# Patient Record
Sex: Male | Born: 1940 | Race: White | Hispanic: No | Marital: Married | Smoking: Former smoker
Health system: Southern US, Academic
[De-identification: ages and names within clinical notes are randomized; demographics above are authoritative.]

## PROBLEM LIST (undated history)

## (undated) DIAGNOSIS — D497 Neoplasm of unspecified behavior of endocrine glands and other parts of nervous system: Secondary | ICD-10-CM

## (undated) DIAGNOSIS — F32A Depression, unspecified: Secondary | ICD-10-CM

## (undated) DIAGNOSIS — R270 Ataxia, unspecified: Secondary | ICD-10-CM

## (undated) DIAGNOSIS — I509 Heart failure, unspecified: Secondary | ICD-10-CM

## (undated) DIAGNOSIS — Z85850 Personal history of malignant neoplasm of thyroid: Secondary | ICD-10-CM

## (undated) DIAGNOSIS — K219 Gastro-esophageal reflux disease without esophagitis: Secondary | ICD-10-CM

## (undated) DIAGNOSIS — E785 Hyperlipidemia, unspecified: Secondary | ICD-10-CM

## (undated) DIAGNOSIS — I10 Essential (primary) hypertension: Secondary | ICD-10-CM

## (undated) DIAGNOSIS — I519 Heart disease, unspecified: Secondary | ICD-10-CM

## (undated) DIAGNOSIS — I2510 Atherosclerotic heart disease of native coronary artery without angina pectoris: Secondary | ICD-10-CM

## (undated) DIAGNOSIS — E079 Disorder of thyroid, unspecified: Secondary | ICD-10-CM

## (undated) DIAGNOSIS — H8109 Meniere's disease, unspecified ear: Secondary | ICD-10-CM

## (undated) DIAGNOSIS — E7800 Pure hypercholesterolemia, unspecified: Secondary | ICD-10-CM

## (undated) DIAGNOSIS — Z950 Presence of cardiac pacemaker: Secondary | ICD-10-CM

## (undated) DIAGNOSIS — C3490 Malignant neoplasm of unspecified part of unspecified bronchus or lung: Secondary | ICD-10-CM

## (undated) HISTORY — PX: HX THYROIDECTOMY: SHX17

## (undated) HISTORY — PX: LUNG REMOVAL, PARTIAL: SHX233

## (undated) HISTORY — PX: HX CORONARY ARTERY BYPASS GRAFT: SHX141

## (undated) HISTORY — PX: HX APPENDECTOMY: SHX54

## (undated) HISTORY — DX: Hyperlipidemia, unspecified: E78.5

## (undated) HISTORY — DX: Essential (primary) hypertension: I10

## (undated) HISTORY — PX: HX MITRAL VALVE REPAIR: 2100001183

## (undated) HISTORY — DX: Heart disease, unspecified: I51.9

## (undated) HISTORY — DX: Malignant neoplasm of unspecified part of unspecified bronchus or lung: C34.90

## (undated) HISTORY — DX: Personal history of malignant neoplasm of thyroid: Z85.850

## (undated) HISTORY — DX: Disorder of thyroid, unspecified: E07.9

## (undated) HISTORY — PX: HX CATARACT REMOVAL: SHX102

## (undated) HISTORY — PX: HX HEART SURGERY: 2100001149

## (undated) HISTORY — DX: Gastro-esophageal reflux disease without esophagitis: K21.9

## (undated) HISTORY — DX: Ataxia, unspecified: R27.0

## (undated) NOTE — Progress Notes (Signed)
 Formatting of this note is different from the original.  Subjective   Patient ID: Tanner [name] is a 21 y.o. male presenting to the Urgent Care with a chief complaint of Edema (Since his blood pressure medication has been changed in his feet and legs).    BLE after being taken off fluid pill.     History provided by:  Patient and relative  History limited by:  Age  Language interpreter used: No      Objective   Vitals:    04/19/24 1939   BP: 110/72   Pulse: 71   Temp: 36.6 C (97.9 F)   TempSrc: Tympanic   Weight: 86.2 kg (190 lb)   Height: 1.829 m (6')   BMI (Calculated): 25.76 kg/m2   BSA (Calculated - sq m): 2.09 sq meters   Resp: 20   SpO2: 96%       Social History     Tobacco Use   Smoking Status Never    Passive exposure: Never   Smokeless Tobacco Current    Types: Snuff       Vital signs reviewed.    Physical Exam  Vitals reviewed.   Constitutional:       Appearance: Normal appearance.   Cardiovascular:      Rate and Rhythm: Normal rate and regular rhythm.      Heart sounds: Normal heart sounds.   Pulmonary:      Effort: Pulmonary effort is normal.      Comments: Congested in chest  Musculoskeletal:         General: Swelling present.      Right lower leg: Edema present.      Left lower leg: Edema present.      Comments: 4+ pitting edema   Skin:     Findings: Erythema present.      Comments: Area from mid-foot to toes are swollen, red, warm to touch. Area under left toe is open and actively bleeding.   Neurological:      General: No focal deficit present.      Mental Status: He is alert and oriented to person, place, and time.   Psychiatric:         Mood and Affect: Mood normal.         Behavior: Behavior normal.         Assessment & Plan        In-House Lab Results:   No results found for this or any previous visit.     In-House Imaging Reads:        Procedure Documentation:  Procedures         ED Course & MDM   MDM - Medical Decision Making: Independent historian used.     Electronically signed by Dufm Stephane Lunger, CRNP at 04/19/2024  7:50 PM EST

---

## 1998-09-03 ENCOUNTER — Emergency Department (HOSPITAL_COMMUNITY): Payer: Self-pay | Admitting: Emergency Medicine

## 2011-09-08 DIAGNOSIS — S2249XA Multiple fractures of ribs, unspecified side, initial encounter for closed fracture: Secondary | ICD-10-CM

## 2011-09-08 DIAGNOSIS — R071 Chest pain on breathing: Secondary | ICD-10-CM

## 2011-09-08 DIAGNOSIS — M25519 Pain in unspecified shoulder: Secondary | ICD-10-CM

## 2011-09-15 DIAGNOSIS — S2239XA Fracture of one rib, unspecified side, initial encounter for closed fracture: Secondary | ICD-10-CM

## 2021-06-24 IMAGING — MR MRI ABDOMEN WITHOUT AND WITH CONTRAST
20 series · 48 of 48 positions shown · IV contrast (Gadavist)
Comparison: None available.

﻿EXAM:  98715   MRI ABDOMEN WITHOUT AND WITH CONTRAST
INDICATION: Renal mass.
TECHNIQUE: Multiplanar multisequential MRI of the kidneys was performed without and with 10 mL of Gadavist.

[Series 8: cor basg bh · coronal · 9.0mm · 1.38mm/px · 1 of 27 slices shown]
[im 1/27]
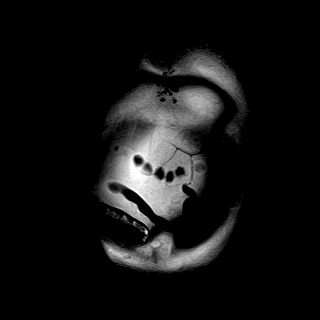

[Series 9: axial in/out phase · axial · 9.0mm · 1.88mm/px · 1 of 25 slices shown (1 of 2)]
[im 1/25]
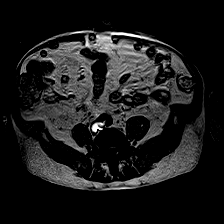

[Series 10: axial in/out phase · axial · 9.0mm · 1.88mm/px · 1 of 25 slices shown (2 of 2)]
[im 1/25]
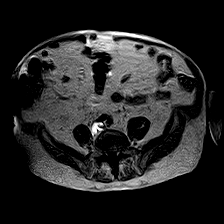

[Series 11: T2 · axial · 9.0mm · 1.64mm/px · z∈[-113,+127]mm · 2 of 25 slices shown]
[im 1/25]
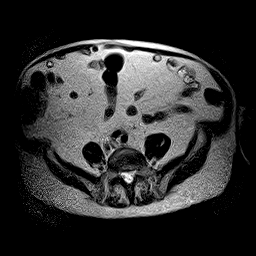
[im 25/25]
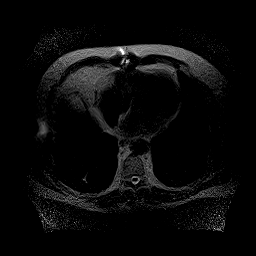

[Series 12: axial basg bh · axial · 9.0mm · 1.31mm/px · z∈[-113,+127]mm · 2 of 25 slices shown]
[im 1/25]
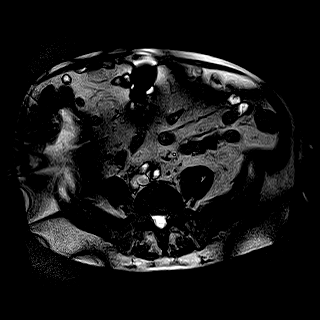
[im 25/25]
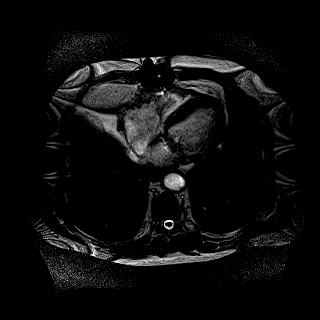

[Series 13: T2 fat-sat · axial · 9.0mm · 1.88mm/px · z∈[-113,+127]mm · 2 of 25 slices shown]
[im 1/25]
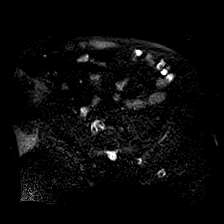
[im 25/25]
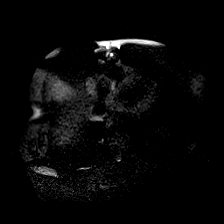

[Series 14: T1 · coronal · 8.0mm · 1.88mm/px · 2 of 28 slices shown (1 of 2)]
[im 1/28]
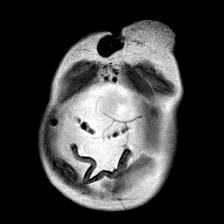
[im 28/28]
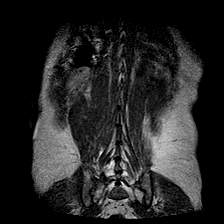

[Series 15: pre axial (person_name) · axial · non-contrast · 8.5mm · 1.56mm/px · z∈[-101,+90]mm · 3 of 46 slices shown]
[im 1/46]
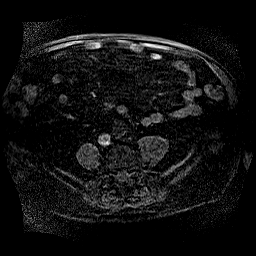
[im 23/46]
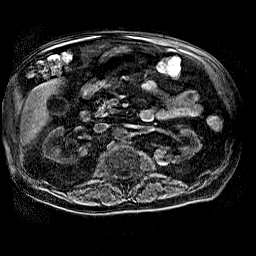
[im 46/46]
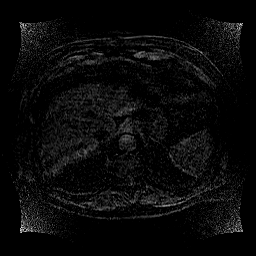

[Series 16: axial (person_name) - · axial · arterial · 8.5mm · 1.56mm/px · z∈[-101,+90]mm · 3 of 46 slices shown (1 of 5)]
[im 1/46]
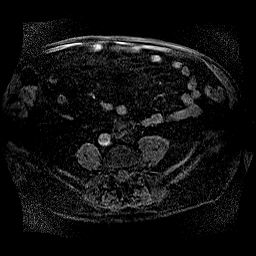
[im 23/46]
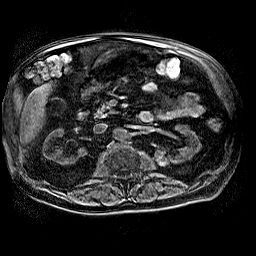
[im 46/46]
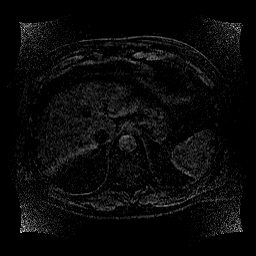

[Series 17: axial (person_name) - · axial · portal-venous · 8.5mm · 1.56mm/px · z∈[-101,+90]mm · 3 of 46 slices shown (2 of 5)]
[im 1/46]
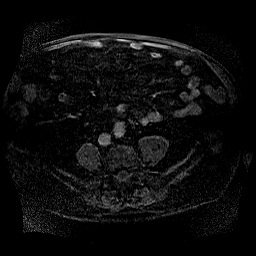
[im 23/46]
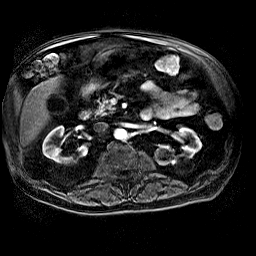
[im 46/46]
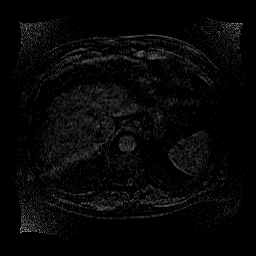

[Series 18: axial (person_name) - · axial · delayed · 8.5mm · 1.56mm/px · z∈[-101,+90]mm · 3 of 46 slices shown (3 of 5)]
[im 1/46]
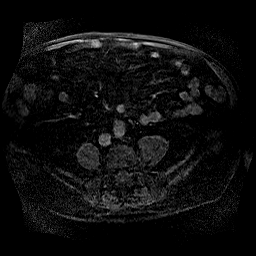
[im 23/46]
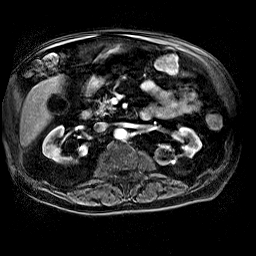
[im 46/46]
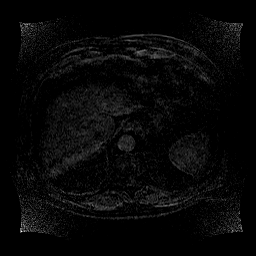

[Series 19: axial (person_name) - · axial · delayed · 8.5mm · 1.56mm/px · z∈[-101,+90]mm · 3 of 46 slices shown (4 of 5)]
[im 1/46]
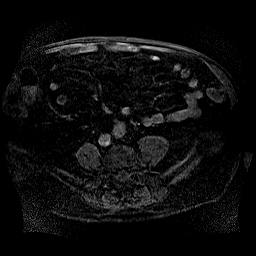
[im 23/46]
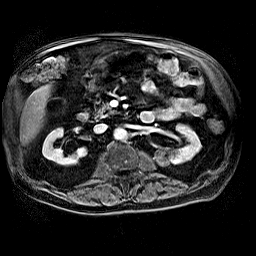
[im 46/46]
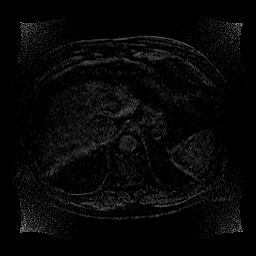

[Series 20: axial (person_name) - · axial · delayed · 8.5mm · 1.56mm/px · z∈[-101,+90]mm · 3 of 46 slices shown (5 of 5)]
[im 1/46]
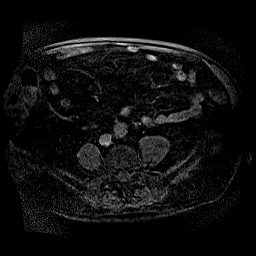
[im 23/46]
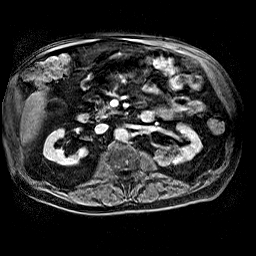
[im 46/46]
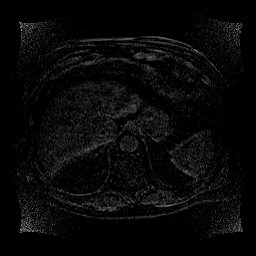

[Series 21: T1 · coronal · delayed · 8.0mm · 1.88mm/px · 2 of 28 slices shown (2 of 2)]
[im 1/28]
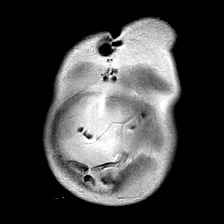
[im 28/28]
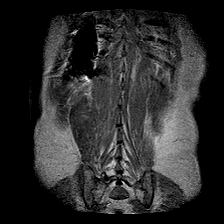

[Series 22: addsub-sub · axial · 8.5mm · 1.56mm/px · z∈[-101,+90]mm · 3 of 46 slices shown]
[im 1/46]
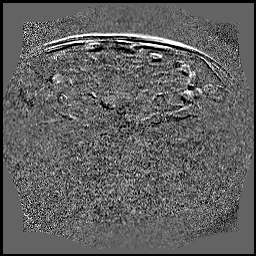
[im 23/46]
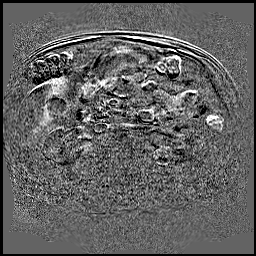
[im 46/46]
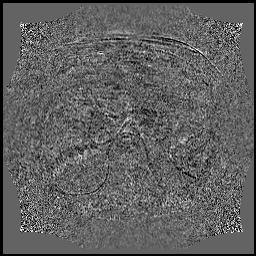

[Series 23: (id) · axial · 8.5mm · 1.56mm/px · z∈[-101,+90]mm · 3 of 46 slices shown (1 of 5)]
[im 1/46]
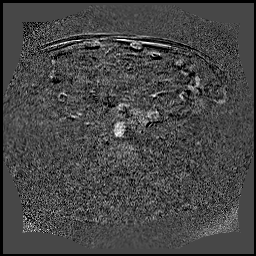
[im 23/46]
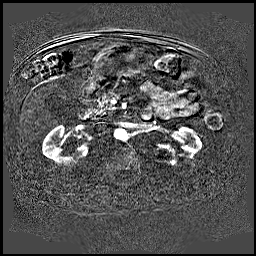
[im 46/46]
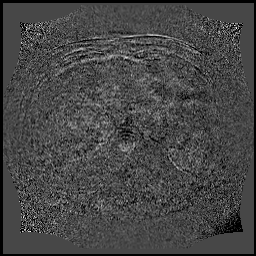

[Series 24: (id) · axial · 8.5mm · 1.56mm/px · z∈[-101,+90]mm · 3 of 46 slices shown (2 of 5)]
[im 1/46]
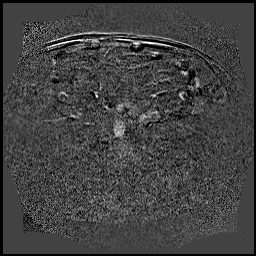
[im 23/46]
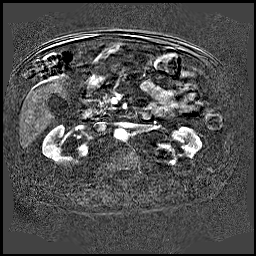
[im 46/46]
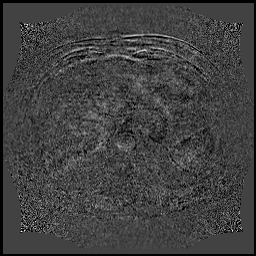

[Series 25: (id) · axial · 8.5mm · 1.56mm/px · z∈[-101,+90]mm · 3 of 46 slices shown (3 of 5)]
[im 1/46]
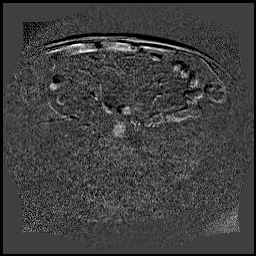
[im 23/46]
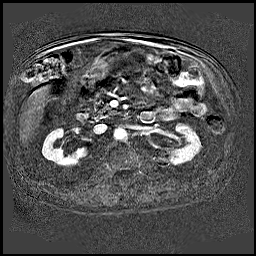
[im 46/46]
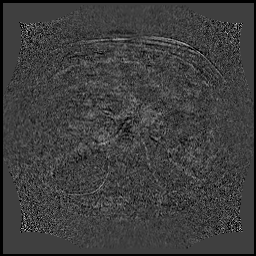

[Series 26: (id) · axial · 8.5mm · 1.56mm/px · z∈[-101,+90]mm · 3 of 46 slices shown (4 of 5)]
[im 1/46]
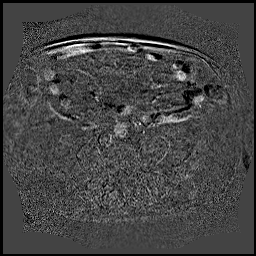
[im 23/46]
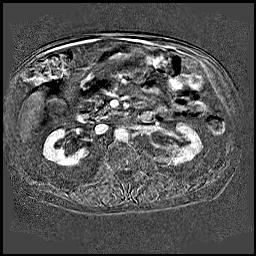
[im 46/46]
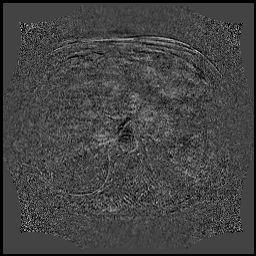

[Series 27: (id) · coronal · 8.0mm · 1.88mm/px · 2 of 28 slices shown (5 of 5)]
[im 1/28]
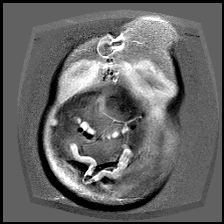
[im 28/28]
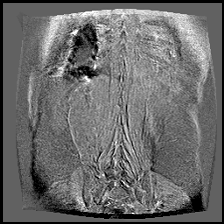

[48 of 48 positions shown; findings below may reference images not displayed]

FINDINGS: There is mild bilateral renal cortical thinning. There is a 3.1 x 2.8 cm heterogeneously enhancing solid mass within the medial aspect of left kidney interpolar region.  Multiple bilateral renal cysts are also noted measuring up to 8.4 cm within right kidney upper pole.  There is suggestion of tiny gallstone.  There is fatty infiltration of the liver.  Spleen, pancreas and adrenal glands are normal.  No ascites or pleural effusion is seen. There is no definite retroperitoneal adenopathy. There is a 3.1 cm mid abdominal aortic aneurysm.
IMPRESSION: Imaging findings suspicious for a 3.1 cm left kidney interpolar region renal cell carcinoma.  Follow-up urology evaluation is recommended.

## 2021-07-01 IMAGING — MR MRI BRAIN WITHOUT AND WITH CONTRAST
13 series · 45 of 48 positions shown · IV contrast (gadavist)
Comparison: None available.

﻿EXAM:  MRI BRAIN WITHOUT AND WITH CONTRAST
INDICATION: Ataxia, history of pituitary tumor.
TECHNIQUE: Multiplanar multisequential MRI of the brain and pituitary gland was performed without and with 5 mL of Gadavist.

[Series 5: DWI · axial · 5.0mm · 1.35mm/px · z∈[-30,+96]mm · 9 of 88 slices shown (1 of 3)]
[im 1/88]
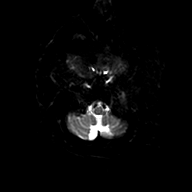
[im 16/88]
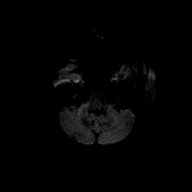
[im 24/88]
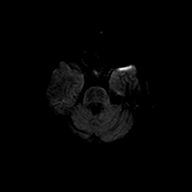
[im 40/88]
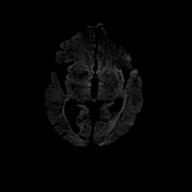
[im 48/88]
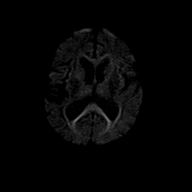
[im 64/88]
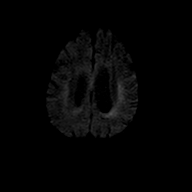
[im 72/88]
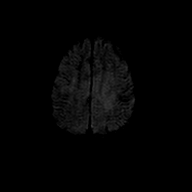
[im 80/88]
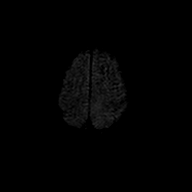
[im 88/88]
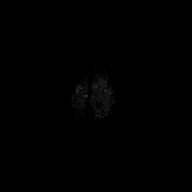

[Series 6: DWI · axial · 5.0mm · 1.35mm/px · z∈[-30,+96]mm · 3 of 22 slices shown (2 of 3)]
[im 1/22]
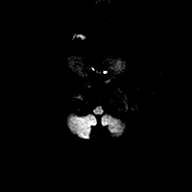
[im 11/22]
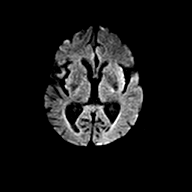
[im 22/22]
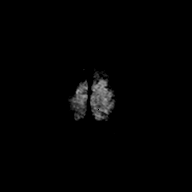

[Series 7: DWI · axial · 5.0mm · 1.35mm/px · z∈[-30,+96]mm · 3 of 22 slices shown (3 of 3)]
[im 1/22]
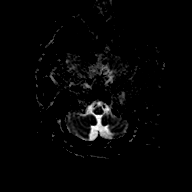
[im 11/22]
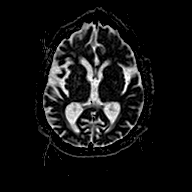
[im 22/22]
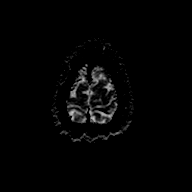

[Series 8: FLAIR · sagittal · 4.0mm · 0.75mm/px · 4 of 26 slices shown (1 of 2)]
[im 1/26]
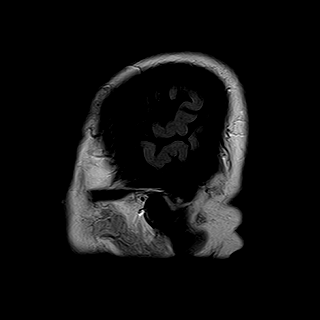
[im 9/26]
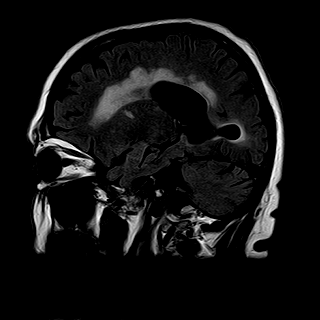
[im 17/26]
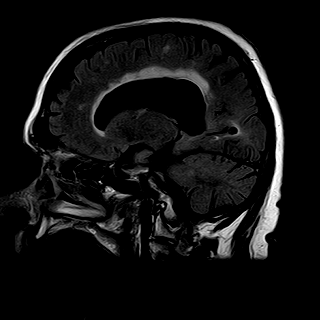
[im 26/26]
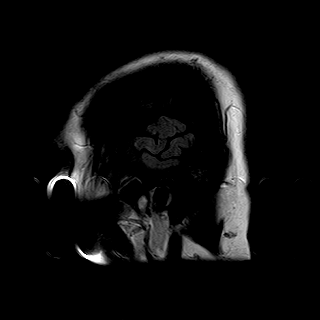

[Series 9: T2 · axial · 5.0mm · 0.43mm/px · z∈[-36,+108]mm · 4 of 25 slices shown]
[im 1/25]
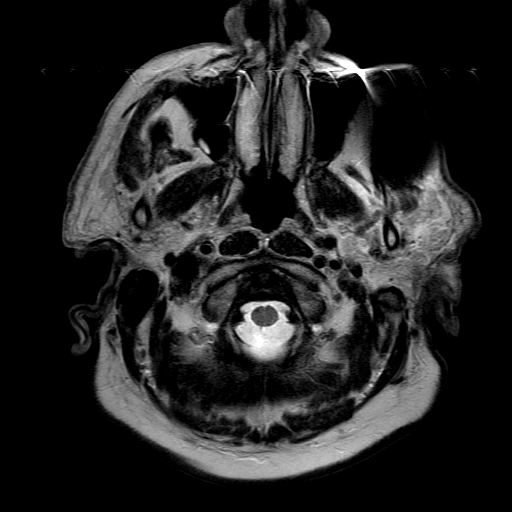
[im 9/25]
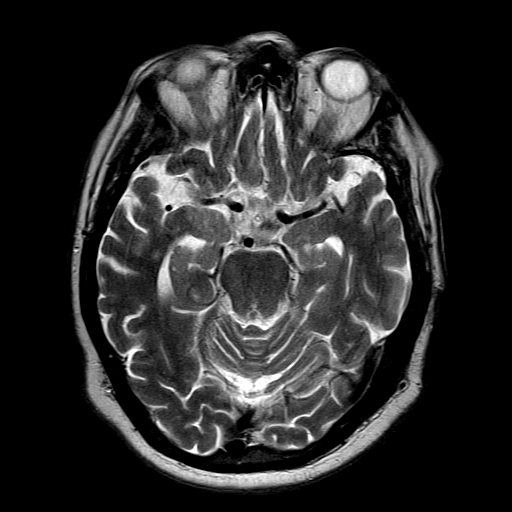
[im 17/25]
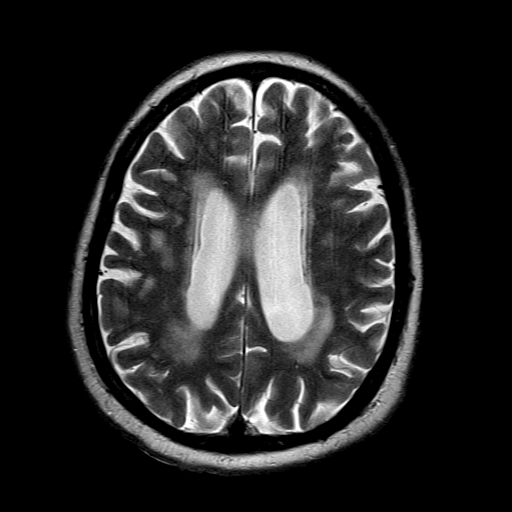
[im 25/25]
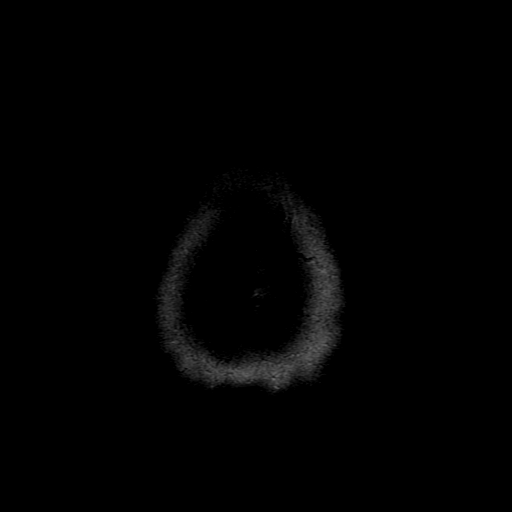

[Series 10: FLAIR · axial · 5.0mm · 0.69mm/px · z∈[-27,+99]mm · 3 of 22 slices shown (2 of 2)]
[im 1/22]
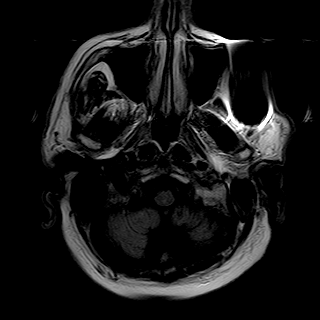
[im 11/22]
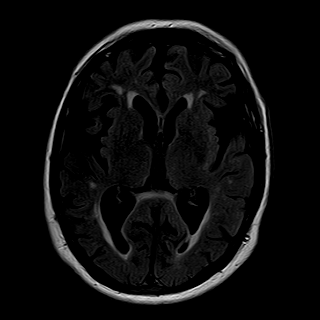
[im 22/22]
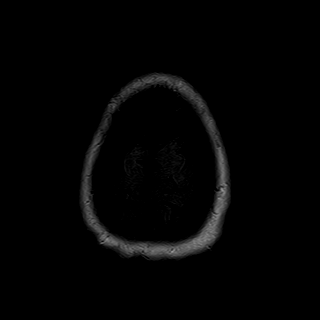

[Series 11: T1 · axial · 5.0mm · 0.49mm/px · z∈[-27,+99]mm · 3 of 22 slices shown (1 of 4)]
[im 1/22]
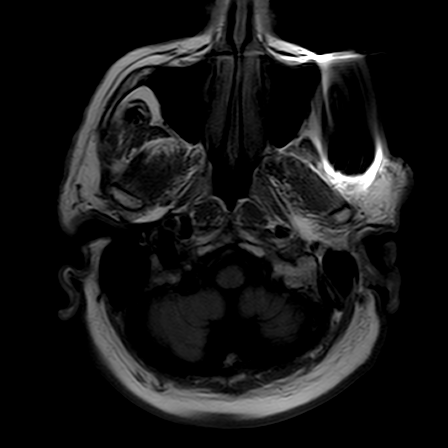
[im 11/22]
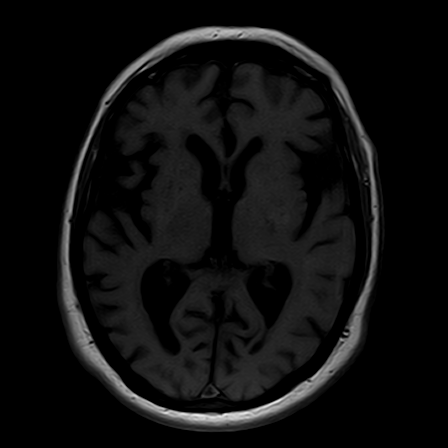
[im 22/22]
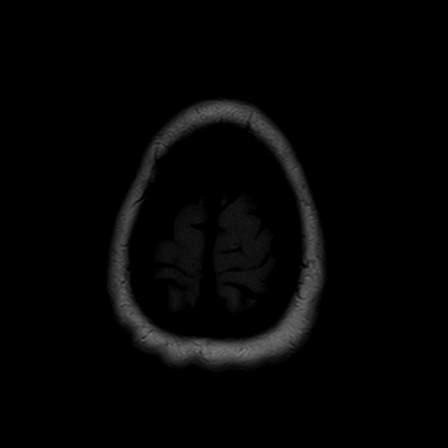

[Series 12: T1 · sagittal · 3.0mm · 0.56mm/px · 2 of 13 slices shown (2 of 4)]
[im 1/13]
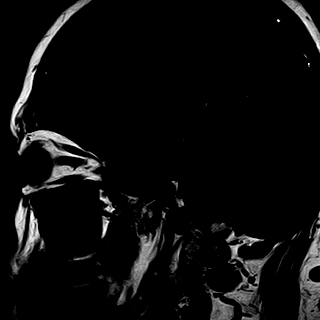
[im 13/13]
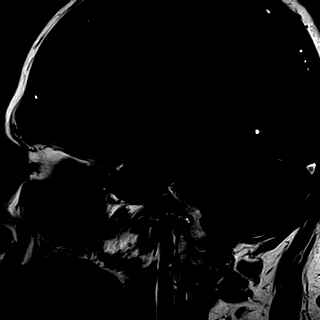

[Series 13: T1 · coronal · 3.0mm · 0.56mm/px · 2 of 13 slices shown (3 of 4)]
[im 1/13]
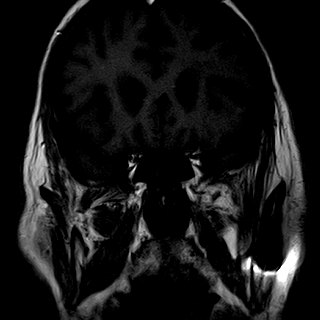
[im 13/13]
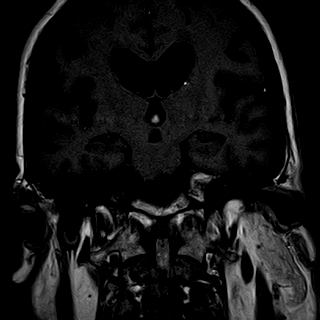

[Series 14: T1 fat-sat · axial · 5.0mm · 0.57mm/px · z∈[-51,+123]mm · 4 of 30 slices shown]
[im 1/30]
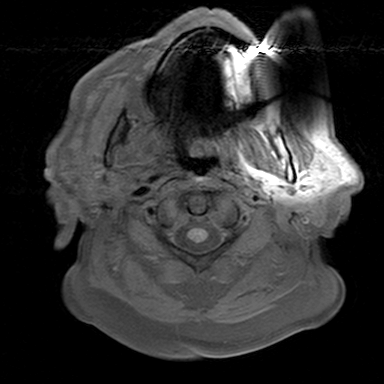
[im 10/30]
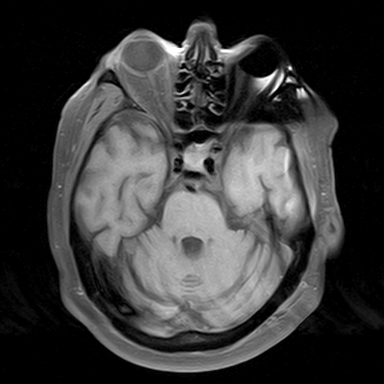
[im 20/30]
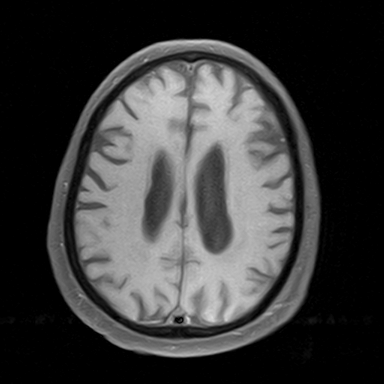
[im 30/30]
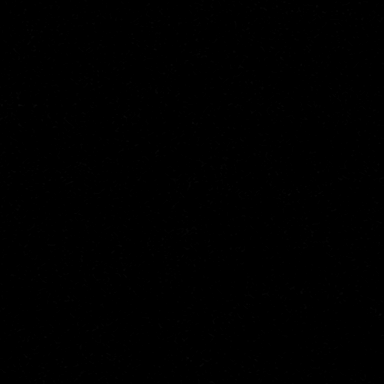

[Series 15: T1 post-contrast · coronal · 3.0mm · 0.56mm/px · 2 of 13 slices shown (1 of 2)]
[im 1/13]
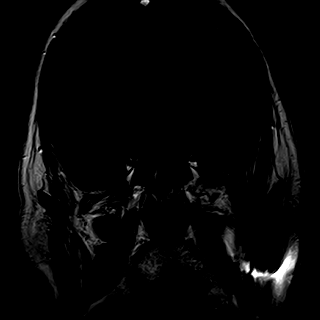
[im 13/13]
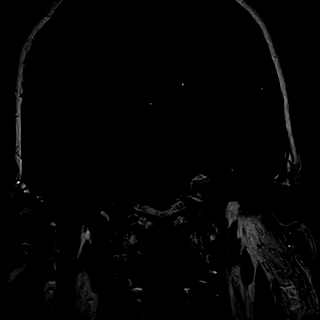

[Series 16: T1 post-contrast · sagittal · 3.0mm · 0.56mm/px · 2 of 13 slices shown (2 of 2)]
[im 1/13]
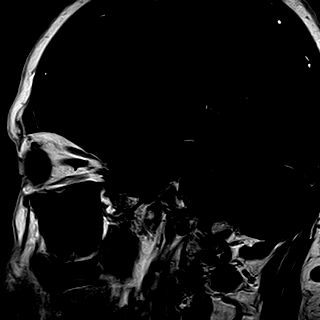
[im 13/13]
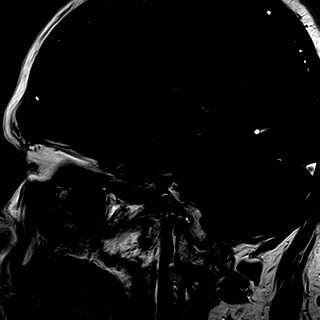

[Series 19: T1 · axial · 5.0mm · 0.57mm/px · z∈[-51,+123]mm · 4 of 30 slices shown (4 of 4)]
[im 1/30]
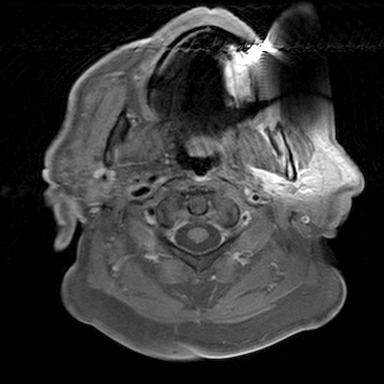
[im 10/30]
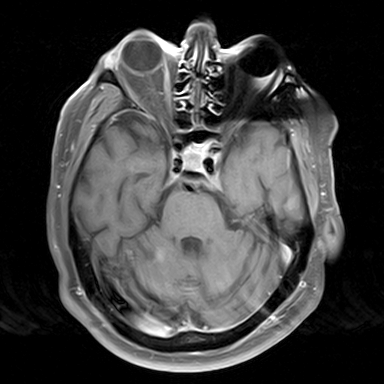
[im 20/30]
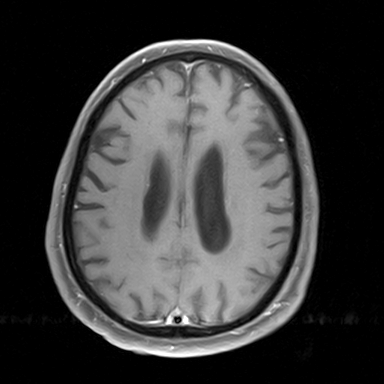
[im 30/30]
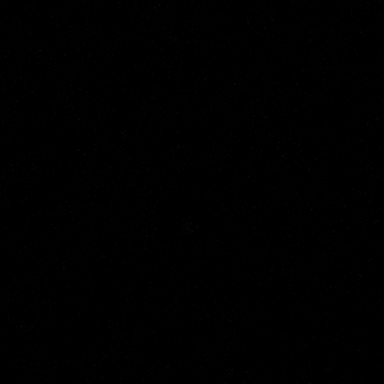

[45 of 48 positions shown; findings below may reference images not displayed]

FINDINGS: Ventricular and sulcal size is normal for the patient's age.  There are moderate chronic small vessel ischemic changes.  There is no mass effect, midline shift or intracranial hemorrhage. There is no evidence of acute infarction. Skull base flow voids and basal cisterns are patent.  There is a 12 x 14 mm heterogeneously enhancing pituitary mass with slight suprasellar extension. The pituitary infundibulum is midline.  There is no mass effect on the optic chiasm.  There is no abnormal parenchymal or leptomeningeal enhancement.  There are no extra-axial fluid collections. Visualized paranasal sinuses, mastoid air cells and orbital contents are unremarkable.
IMPRESSION: 1. Suggestion of a pituitary macroadenoma as detailed above. No acute intracranial abnormality.  

 2. No abnormal parenchymal or leptomeningeal enhancement.

## 2021-09-25 ENCOUNTER — Ambulatory Visit (INDEPENDENT_AMBULATORY_CARE_PROVIDER_SITE_OTHER): Payer: Self-pay | Admitting: Student in an Organized Health Care Education/Training Program

## 2021-10-13 ENCOUNTER — Ambulatory Visit (INDEPENDENT_AMBULATORY_CARE_PROVIDER_SITE_OTHER): Payer: 59 | Admitting: Student in an Organized Health Care Education/Training Program

## 2021-10-13 ENCOUNTER — Encounter (INDEPENDENT_AMBULATORY_CARE_PROVIDER_SITE_OTHER): Payer: Self-pay | Admitting: Student in an Organized Health Care Education/Training Program

## 2021-10-13 ENCOUNTER — Other Ambulatory Visit: Payer: Self-pay

## 2021-10-13 VITALS — BP 136/71 | HR 58 | Resp 16 | Ht 72.0 in | Wt 207.0 lb

## 2021-10-13 DIAGNOSIS — Z125 Encounter for screening for malignant neoplasm of prostate: Secondary | ICD-10-CM

## 2021-10-13 DIAGNOSIS — R9389 Abnormal findings on diagnostic imaging of other specified body structures: Secondary | ICD-10-CM

## 2021-10-13 DIAGNOSIS — N2889 Other specified disorders of kidney and ureter: Secondary | ICD-10-CM

## 2021-10-13 DIAGNOSIS — Z85850 Personal history of malignant neoplasm of thyroid: Secondary | ICD-10-CM

## 2021-10-13 DIAGNOSIS — N401 Enlarged prostate with lower urinary tract symptoms: Secondary | ICD-10-CM

## 2021-10-13 DIAGNOSIS — Z85118 Personal history of other malignant neoplasm of bronchus and lung: Secondary | ICD-10-CM

## 2021-10-13 DIAGNOSIS — N400 Enlarged prostate without lower urinary tract symptoms: Secondary | ICD-10-CM

## 2021-10-13 MED ORDER — TAMSULOSIN 0.4 MG CAPSULE
0.4000 mg | ORAL_CAPSULE | Freq: Every evening | ORAL | 5 refills | Status: AC
Start: 2021-10-13 — End: ?

## 2021-10-14 NOTE — Progress Notes (Signed)
[place]    Progress Note    Name: Tanner [name] MRN:  [id number]   Date: 10/13/2021 Age: 81 y.o.       Chief Complaint: Mass (New Tanner [name], possible renal cell carcinoma)    Subjective:   Tanner [name] is a pleasant 81 year old man who presents with a recent abnormal MRI demonstrating a left enhancing renal mass measuring 3 cm.  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  He denies a family history of urologic malignancy.  He reports urinary problems with decreased force of stream and dribbling.  He is not been on BPH medications in the past. He denies fevers, chills, nausea, vomiting, hematuria, dysuria, flank pain, incontinence, dribbling, hesitancy, suprapubic pain, headaches, vision changes, shortness of breath, chest pain.    Objective :  BP 136/71   Pulse 58   Resp 16   Ht 1.829 m (6')   Wt 93.9 kg (207 lb)   SpO2 98%   BMI 28.07 kg/m       Gen: NAD, alert  Pulm: unlabored at rest  CV: palpable pulses  Abd: soft, Nt/ND  GU: no suprapubic tenderness, no CVAT    Data reviewed:    Current Outpatient Medications   Medication Sig   . albuterol sulfate (PROVENTIL OR VENTOLIN OR PROAIR) 90 mcg/actuation Inhalation oral inhaler Take 2 Puffs by inhalation Once a day   . aspirin 81 mg Oral Tablet, Chewable Chew 1 Tablet (81 mg total) Once a day   . atorvastatin (LIPITOR) 80 mg Oral Tablet Take 1 Tablet (80 mg total) by mouth Every night   . bromocriptine (PARLODEL) 2.5 mg Oral Tablet Take 1 Tablet (2.5 mg total) by mouth Once a day   . calcium carbonate-vitamin D3 600 mg-10 mcg (400 unit) Oral Tablet Take 2 Tablets by mouth Once a day   . diphenhydrAMINE (BENADRYL) 50 mg Oral Capsule Take 1 Capsule (50 mg total) by mouth Every night   . fluticasone propion-salmeteroL (ADVAIR) 500-50 mcg/dose Inhalation oral diskus inhaler Take 1 Puff (1 INHALATION  total) by inhalation Twice daily   .  ibuprofen (MOTRIN) 400 mg Oral Tablet Take 1 Tablet (400 mg total) by mouth Twice per day as needed   . ketotifen fumarate (EYE ITCH RELIEF) 0.025 % (0.035 %) Ophthalmic Drops Instill 1 Drop into both eyes Twice daily   . levothyroxine (SYNTHROID) 100 mcg Oral Tablet Take 1 Tablet (100 mcg total) by mouth Once a day   . losartan (COZAAR) 50 mg Oral Tablet Take 0.5 Tablets (25 mg total) by mouth Once a day   . metoprolol tartrate (LOPRESSOR) 25 mg Oral Tablet Take 0.5 Tablets (12.5 mg total) by mouth Twice daily   . omeprazole (PRILOSEC) 20 mg Oral Capsule, Delayed Release(E.C.) Take 1 Capsule (20 mg total) by mouth Once a day   . tamsulosin (FLOMAX) 0.4 mg Oral Capsule Take 1 Capsule (0.4 mg total) by mouth Every evening after dinner   . torsemide (DEMADEX) 20 mg Oral Tablet Take 0.5 Tablets (10 mg total) by mouth Once a day     Assessment/Plan  Problem List Items Addressed This Visit    None  Visit Diagnoses     Renal mass    -  Primary    Relevant Orders    CT BIOPSY RENAL        Left enhancing renal mass,  3.1cm, with history of lung and thyroid cancer   I had a very lengthy discussion with Tanner [name] where I reviewed in detail recent imaging including MRI of the abdomen. Importantly, the Tanner [name]'s renal cell carcinoma-specific risk factors include: hypertension, obesity, previous nicotine dependence    Tanner [name] was educated and counseled on the incidence, nature and risk factors for renal cell carcinoma; notably, I stressed the 80-90% probability that a solid mass of this nature and imaging characteristics represents malignancy. Treatment options were discussed including observation, percutaneous biopsy, radical nephrectomy (open v. laparoscopic), partial nephrectomy (open v. laparoscopic), thermal ablation (open v. laparoscopic v. percutaneous). The risks, advantages, and disadvantages of each approach were discussed in detail.     We briefly discussed the role of percutaneous, image-guided renal mass biopsy in  patients in whom it might impact management, particularly in patients with clinical or radiographic findings suggestive of lymphoma, abscess or metastasis.  We also discussed the limitations of renal mass biopsy including the non-diagnostic yield and hybrid-type tumors which may lend a false sense of reassurance of a perceived indolent tumor such as oncocytoma.  Additionally, renal mass biopsy is not without risks with well-documented risks of retroperitoneal hemorrhage and possible, albeit rare, biopsy tract seeding.    For clinical T1a renal masses (<4.0 cm) in an otherwise health Tanner [name] without coexistant medical comorbidities or baseline chronic kidney disease, complete surgical excision achieved by either partial nephrectomy or radical nephrectomy when nephron sparing surgery is not technically feasible are considered the standard of care per the 2009 American Urological Association "Guideline for the Management of the Clinical Stage 1 Renal Mass".  Partial nephrectomy, if possible, is recommended when the need exists to preserve renal function, however, can be associated with increased urologic morbidity including higher risk of hemorrhage and urinary fistula.    For clinical T1b+ (>4 cm) renal masses in an otherwise healthy Tanner [name], both radical nephrectomy in the setting of a normal contralateral kidney or partial nephrectomy when the need exists to preserve renal function are considered standards, each given equal weight per the 2009 American Urological Association "Guideline for the Management of the Clinical Stage 1 Renal Mass".  I spent time discussing with Tanner [name] that the overwhelming majority of studies evaluating partial nephrectomy in higher stage (cT1b+) are observational, retrospective, reported findings on samples of convenience that were not randomized to treatments and involved only one treatment group. There are inherent, unknownand unquantifiable biases within each study because of the lack  of randomization and precludes confident evidence-based guideline directives      Tanner [name]'s potential risk factors for the development of chronic kidney disease include:  hypertension, cardiovascular disease, nicotine dependence, obesity, dyslipidemia, positive family history of kidney disease, advanced age > 44 years.  We discussed the understood risks of either open or minimally invasive renal surgery including bleeding requiring blood transfusion, infection, adjacent organ injury, urine leak, positive margins, severe pain and standard operative risks such as myocardial infarction, stroke, venous thromboembolism (VTE), pneumonia, PE and even a 0.2% chance of death.      Thermal ablative therapies, either cryoablation or radiofrequency ablation, are considered less-invasive treatment options, however, may be associated with slightly higher risk of local tumor recurrence and more challenging surgical salvage; additionally, measures of success are less well-defined.    Compared to surgical extirpation, there are inherent, unknownand unquantifiable biases within the evidence for thermal ablation due to the lack of randomization and precludes confident evidence-based guideline directives.    Active surveillance with delayed intervention was  also discussed with Tanner [name]  as an option in patients who want to avoid surgery and are willing to accept an increased risk of tumor progression compared to either radical nephrectomy or partial nephrectomy.  Ideally, active surveillance is best utilized in a small renal mass (<4 cm) in a Tanner [name] with high operative and anesthetic risks.    At this time given his history of 2 other malignancies which were rather aggressive per the Tanner [name]'s report we will need to confirm that this is renal cell carcinoma versus a metastatic focus.  A CT renal mass biopsy was ordered.  The Tanner [name] was instructed to discontinue his aspirin 7 days before and resume his aspirin 7 days after the biopsy.   He will follow-up following biopsy for further discussion about next best steps.      BPH/LUTS  . Discussed the differential diagnosis, pathophysiology and nature of benign prostate enlargement causing lower urinary tract symptoms  . Counseled Tanner [name] on conservative management options including appropriate fluid management, avoidance of diuretics including caffeine and alcohol  . Prescription provided for Tamsulosin Hydrochloride (FlomaxT) 0.4 mg P.O. daily:    . I have discussed in great detail with the Tanner [name] the treatment of prostate enlargement/lower urinary tract symptoms using tamsulosin.  I have explained my rationale for using tamsulosin as well as potential risks of asthenia (2%), dizziness (5%), rhinitis (3%) and abnormal ejaculation (11%). I encouraged Tanner [name] to report any prior or current history of alpha-1 antagonist use to their opthalmic surgeon before undergoing any eye surgery due to the risk of intraoperative floppy iris syndrome.  The Tanner [name] expressed an understanding of the treatment, possible reactions, and possible prognosis.        Prostate cancer screening  . Based on Tanner [name]'s clinical findings including his recent prostate specific antigen level, age, ethnicity and relevant risk factors and in accordance with the American Urological Association (AUA) & [place] (NCCN) published screening guidelines, I would recommend the following:  . No further prostate specific antigen screening in men over age 66 due to a higher prevalence of prostate cancer but greater risk of competing diseases and overdiagnosis when compared to younger men and strong evidence for a lack of treatment benefit for men in this age group          Landis Gandy, DO     A combined total of 45 minutes were spent preparing to see the Tanner [name], reviewing previous records, ordering tests/medications/procedures, documenting the clinical encounter as well as performing a medically appropriate  evaluation and independently interpreting results and communicating them to the Tanner [name]/family/caregiver as specifically outlined above in the impression and plan.

## 2021-10-20 ENCOUNTER — Ambulatory Visit
Admission: RE | Admit: 2021-10-20 | Discharge: 2021-10-20 | Disposition: A | Discharge status: Home / Self Care | Payer: 59 | Source: Ambulatory Visit | Attending: Student in an Organized Health Care Education/Training Program | Admitting: Student in an Organized Health Care Education/Training Program

## 2021-10-20 ENCOUNTER — Other Ambulatory Visit: Payer: Self-pay

## 2021-10-27 ENCOUNTER — Other Ambulatory Visit: Payer: Self-pay

## 2021-10-27 ENCOUNTER — Encounter (HOSPITAL_COMMUNITY): Payer: Self-pay

## 2021-10-27 ENCOUNTER — Emergency Department (EMERGENCY_DEPARTMENT_HOSPITAL): Payer: 59

## 2021-10-27 ENCOUNTER — Inpatient Hospital Stay (HOSPITAL_COMMUNITY): Payer: 59 | Admitting: Internal Medicine

## 2021-10-27 ENCOUNTER — Inpatient Hospital Stay
Admission: EM | Admit: 2021-10-27 | Discharge: 2021-10-30 | DRG: 243 | Disposition: A | Discharge status: Home / Self Care | Payer: 59 | Attending: Nephrology | Admitting: Nephrology

## 2021-10-27 ENCOUNTER — Ambulatory Visit (HOSPITAL_COMMUNITY)
Admission: RE | Admit: 2021-10-27 | Discharge: 2021-10-27 | Disposition: A | Discharge status: Home / Self Care | Payer: 59 | Source: Ambulatory Visit | Attending: Student in an Organized Health Care Education/Training Program | Admitting: Student in an Organized Health Care Education/Training Program

## 2021-10-27 ENCOUNTER — Ambulatory Visit (HOSPITAL_COMMUNITY): Payer: Self-pay

## 2021-10-27 DIAGNOSIS — I509 Heart failure, unspecified: Secondary | ICD-10-CM

## 2021-10-27 DIAGNOSIS — I5032 Chronic diastolic (congestive) heart failure: Secondary | ICD-10-CM | POA: Diagnosis present

## 2021-10-27 DIAGNOSIS — Z85118 Personal history of other malignant neoplasm of bronchus and lung: Secondary | ICD-10-CM

## 2021-10-27 DIAGNOSIS — Z79890 Hormone replacement therapy (postmenopausal): Secondary | ICD-10-CM

## 2021-10-27 DIAGNOSIS — E079 Disorder of thyroid, unspecified: Secondary | ICD-10-CM

## 2021-10-27 DIAGNOSIS — R0602 Shortness of breath: Secondary | ICD-10-CM

## 2021-10-27 DIAGNOSIS — I071 Rheumatic tricuspid insufficiency: Secondary | ICD-10-CM | POA: Diagnosis present

## 2021-10-27 DIAGNOSIS — R001 Bradycardia, unspecified: Principal | ICD-10-CM | POA: Diagnosis present

## 2021-10-27 DIAGNOSIS — N2889 Other specified disorders of kidney and ureter: Secondary | ICD-10-CM | POA: Insufficient documentation

## 2021-10-27 DIAGNOSIS — K219 Gastro-esophageal reflux disease without esophagitis: Secondary | ICD-10-CM | POA: Diagnosis present

## 2021-10-27 DIAGNOSIS — I495 Sick sinus syndrome: Principal | ICD-10-CM | POA: Diagnosis present

## 2021-10-27 DIAGNOSIS — E890 Postprocedural hypothyroidism: Secondary | ICD-10-CM | POA: Diagnosis present

## 2021-10-27 DIAGNOSIS — Z87891 Personal history of nicotine dependence: Secondary | ICD-10-CM

## 2021-10-27 DIAGNOSIS — J449 Chronic obstructive pulmonary disease, unspecified: Secondary | ICD-10-CM | POA: Diagnosis present

## 2021-10-27 DIAGNOSIS — I10 Essential (primary) hypertension: Secondary | ICD-10-CM | POA: Diagnosis present

## 2021-10-27 DIAGNOSIS — Z7982 Long term (current) use of aspirin: Secondary | ICD-10-CM

## 2021-10-27 DIAGNOSIS — I2510 Atherosclerotic heart disease of native coronary artery without angina pectoris: Secondary | ICD-10-CM | POA: Diagnosis present

## 2021-10-27 DIAGNOSIS — R531 Weakness: Secondary | ICD-10-CM

## 2021-10-27 DIAGNOSIS — I110 Hypertensive heart disease with heart failure: Secondary | ICD-10-CM | POA: Diagnosis present

## 2021-10-27 DIAGNOSIS — E785 Hyperlipidemia, unspecified: Secondary | ICD-10-CM | POA: Diagnosis present

## 2021-10-27 DIAGNOSIS — Z7951 Long term (current) use of inhaled steroids: Secondary | ICD-10-CM

## 2021-10-27 DIAGNOSIS — I441 Atrioventricular block, second degree: Secondary | ICD-10-CM | POA: Diagnosis present

## 2021-10-27 DIAGNOSIS — Z951 Presence of aortocoronary bypass graft: Secondary | ICD-10-CM

## 2021-10-27 LAB — CBC WITH DIFF
BASOPHIL #: 0 10*3/uL (ref 0.00–0.30)
BASOPHIL %: 1 % (ref 0–3)
EOSINOPHIL #: 0.1 10*3/uL (ref 0.00–0.80)
EOSINOPHIL %: 3 % (ref 0–7)
HCT: 39.1 % — ABNORMAL LOW (ref 42.0–51.0)
HGB: 13.1 g/dL — ABNORMAL LOW (ref 13.5–18.0)
LYMPHOCYTE #: 1.3 10*3/uL (ref 1.10–5.00)
LYMPHOCYTE %: 26 % (ref 25–45)
MCH: 30.4 pg (ref 27.0–32.0)
MCHC: 33.5 g/dL (ref 32.0–36.0)
MCV: 90.8 fL (ref 78.0–99.0)
MONOCYTE #: 0.4 10*3/uL (ref 0.00–1.30)
MONOCYTE %: 8 % (ref 0–12)
MPV: 8 fL (ref 7.4–10.4)
NEUTROPHIL #: 3.3 10*3/uL (ref 1.80–8.40)
NEUTROPHIL %: 64 % (ref 40–76)
PLATELETS: 152 10*3/uL (ref 140–440)
RBC: 4.31 10*6/uL (ref 4.20–6.00)
RDW: 14.4 % (ref 11.6–14.8)
WBC: 5.2 10*3/uL (ref 4.0–10.5)
WBCS UNCORRECTED: 5.2 10*3/uL

## 2021-10-27 LAB — COMPREHENSIVE METABOLIC PANEL, NON-FASTING
ALBUMIN/GLOBULIN RATIO: 1.4 (ref 0.8–1.4)
ALBUMIN: 3.6 g/dL (ref 3.5–5.7)
ALKALINE PHOSPHATASE: 91 U/L (ref 34–104)
ALT (SGPT): 10 U/L (ref 7–52)
ANION GAP: 6 mmol/L — ABNORMAL LOW (ref 10–20)
AST (SGOT): 14 U/L (ref 13–39)
BILIRUBIN TOTAL: 0.8 mg/dL (ref 0.3–1.2)
BUN/CREA RATIO: 13 (ref 6–22)
BUN: 13 mg/dL (ref 7–25)
CALCIUM, CORRECTED: 9.3 mg/dL (ref 8.9–10.8)
CALCIUM: 8.9 mg/dL (ref 8.6–10.3)
CHLORIDE: 111 mmol/L — ABNORMAL HIGH (ref 98–107)
CO2 TOTAL: 26 mmol/L (ref 21–31)
CREATININE: 1.04 mg/dL (ref 0.60–1.30)
ESTIMATED GFR: 72 mL/min/{1.73_m2} (ref >59)
GLOBULIN: 2.5 — ABNORMAL LOW (ref 2.9–5.4)
GLUCOSE: 83 mg/dL (ref 74–109)
OSMOLALITY, CALCULATED: 284 mOsm/kg (ref 270–290)
POTASSIUM: 4.2 mmol/L (ref 3.5–5.1)
PROTEIN TOTAL: 6.1 g/dL — ABNORMAL LOW (ref 6.4–8.9)
SODIUM: 143 mmol/L (ref 136–145)

## 2021-10-27 LAB — ECG 12 LEAD
Atrial Rate: 46 {beats}/min
Calculated R Axis: 23 degrees
Calculated T Axis: 33 degrees
QRS Duration: 160 ms
QT Interval: 548 ms
QTC Calculation: 463 ms
Ventricular rate: 43 {beats}/min

## 2021-10-27 LAB — THYROID STIMULATING HORMONE (SENSITIVE TSH): TSH: 2.075 u[IU]/mL (ref 0.450–5.330)

## 2021-10-27 LAB — MAGNESIUM: MAGNESIUM: 2 mg/dL (ref 1.9–2.7)

## 2021-10-27 LAB — TROPONIN-I
TROPONIN I: 11 ng/L (ref <20)
TROPONIN I: 13 ng/L (ref <20)
TROPONIN I: 13 ng/L (ref <20)

## 2021-10-27 LAB — PLATELET FUNCTION COLLAGEN: PFA COLLAGEN/EPI RESULT: 115 seconds (ref 84–152)

## 2021-10-27 LAB — B-TYPE NATRIURETIC PEPTIDE: BNP: 366 pg/mL — ABNORMAL HIGH (ref 5–100)

## 2021-10-27 MED ORDER — DIPHENHYDRAMINE 25 MG CAPSULE
50.0000 mg | ORAL_CAPSULE | Freq: Every evening | ORAL | Status: DC
Start: 2021-10-27 — End: 2021-10-30
  Administered 2021-10-27 – 2021-10-29 (×3): 50 mg via ORAL
  Filled 2021-10-27 (×3): qty 2

## 2021-10-27 MED ORDER — ACETAMINOPHEN 325 MG TABLET
650.0000 mg | ORAL_TABLET | ORAL | Status: DC | PRN
Start: 2021-10-27 — End: 2021-10-30

## 2021-10-27 MED ORDER — LOSARTAN 50 MG TABLET
25.0000 mg | ORAL_TABLET | Freq: Every day | ORAL | Status: DC
Start: 2021-10-28 — End: 2021-10-30
  Administered 2021-10-28 – 2021-10-30 (×3): 25 mg via ORAL
  Filled 2021-10-27 (×3): qty 1

## 2021-10-27 MED ORDER — ATORVASTATIN 40 MG TABLET
80.0000 mg | ORAL_TABLET | Freq: Every evening | ORAL | Status: DC
Start: 2021-10-27 — End: 2021-10-30
  Administered 2021-10-27 – 2021-10-29 (×3): 80 mg via ORAL
  Filled 2021-10-27 (×3): qty 2

## 2021-10-27 MED ORDER — TORSEMIDE 5 MG TABLET
10.0000 mg | ORAL_TABLET | Freq: Every day | ORAL | Status: DC
Start: 2021-10-28 — End: 2021-10-30
  Administered 2021-10-28 – 2021-10-30 (×3): 10 mg via ORAL
  Filled 2021-10-27 (×4): qty 2

## 2021-10-27 MED ORDER — GLUCAGON 1 MG/ML SOLUTION FOR INJECTION
4.0000 mg | Freq: Once | INTRAMUSCULAR | Status: DC
Start: 2021-10-27 — End: 2021-10-27

## 2021-10-27 MED ORDER — BROMOCRIPTINE 2.5 MG TABLET
2.5000 mg | ORAL_TABLET | Freq: Every day | ORAL | Status: DC
Start: 2021-10-28 — End: 2021-10-30
  Administered 2021-10-28 – 2021-10-30 (×3): 0 mg via ORAL
  Filled 2021-10-27 (×4): qty 1

## 2021-10-27 MED ORDER — IPRATROPIUM 0.5 MG-ALBUTEROL 3 MG (2.5 MG BASE)/3 ML NEBULIZATION SOLN
3.0000 mL | INHALATION_SOLUTION | RESPIRATORY_TRACT | Status: DC | PRN
Start: 2021-10-27 — End: 2021-10-30

## 2021-10-27 MED ORDER — GLUCAGON 1 MG/ML SOLUTION FOR INJECTION
3.0000 mg | Freq: Once | INTRAMUSCULAR | Status: AC
Start: 2021-10-27 — End: 2021-10-27
  Administered 2021-10-27: 3 mg via INTRAVENOUS

## 2021-10-27 MED ORDER — LEVOTHYROXINE 100 MCG TABLET
100.0000 ug | ORAL_TABLET | Freq: Every day | ORAL | Status: DC
Start: 2021-10-28 — End: 2021-10-30
  Administered 2021-10-28 – 2021-10-30 (×3): 100 ug via ORAL
  Filled 2021-10-27 (×3): qty 1

## 2021-10-27 MED ORDER — TAMSULOSIN 0.4 MG CAPSULE
0.4000 mg | ORAL_CAPSULE | Freq: Every evening | ORAL | Status: DC
Start: 2021-10-27 — End: 2021-10-30
  Administered 2021-10-27 – 2021-10-29 (×3): 0.4 mg via ORAL
  Filled 2021-10-27 (×3): qty 1

## 2021-10-27 MED ORDER — DOCUSATE SODIUM 100 MG CAPSULE
100.0000 mg | ORAL_CAPSULE | Freq: Two times a day (BID) | ORAL | Status: DC | PRN
Start: 2021-10-27 — End: 2021-10-30
  Administered 2021-10-28: 100 mg via ORAL
  Filled 2021-10-27: qty 1

## 2021-10-27 MED ORDER — ALUMINUM-MAG HYDROXIDE-SIMETHICONE 200 MG-200 MG-20 MG/5 ML ORAL SUSP
10.0000 mL | ORAL | Status: DC | PRN
Start: 2021-10-27 — End: 2021-10-30

## 2021-10-27 MED ORDER — GLUCAGON 1 MG/ML SOLUTION FOR INJECTION
4.0000 mg/h | INTRAVENOUS | Status: DC
Start: 2021-10-27 — End: 2021-10-27
  Filled 2021-10-27 (×9): qty 10

## 2021-10-27 MED ORDER — GLUCAGON 1 MG/ML SOLUTION FOR INJECTION
INTRAMUSCULAR | Status: AC
Start: 2021-10-27 — End: 2021-10-27
  Filled 2021-10-27: qty 3

## 2021-10-27 MED ORDER — ENOXAPARIN 40 MG/0.4 ML SUBCUTANEOUS SYRINGE
40.0000 mg | INJECTION | SUBCUTANEOUS | Status: DC
Start: 2021-10-27 — End: 2021-10-30
  Administered 2021-10-27 – 2021-10-29 (×3): 40 mg via SUBCUTANEOUS
  Filled 2021-10-27 (×3): qty 0.4

## 2021-10-27 MED ORDER — LIDOCAINE HCL 20 MG/ML (2 %) INJECTION SOLUTION
INTRAMUSCULAR | Status: AC
Start: 2021-10-27 — End: 2021-10-27
  Filled 2021-10-27: qty 20

## 2021-10-27 MED ORDER — FENTANYL (PF) 50 MCG/ML INJECTION SOLUTION
INTRAMUSCULAR | Status: AC
Start: 2021-10-27 — End: 2021-10-27
  Filled 2021-10-27: qty 2

## 2021-10-27 MED ORDER — ASPIRIN 81 MG CHEWABLE TABLET
81.0000 mg | CHEWABLE_TABLET | Freq: Every day | ORAL | Status: DC
Start: 2021-10-28 — End: 2021-10-30
  Administered 2021-10-28 – 2021-10-30 (×3): 81 mg via ORAL
  Filled 2021-10-27 (×3): qty 1

## 2021-10-27 MED ORDER — MIDAZOLAM 5 MG/ML INJECTION WRAPPER
INTRAMUSCULAR | Status: AC
Start: 2021-10-27 — End: 2021-10-27
  Filled 2021-10-27: qty 1

## 2021-10-27 NOTE — ED Nurses Note (Signed)
Report called to [id number] at this time. Pt going to Room 363A at this time.

## 2021-10-27 NOTE — Care Plan (Addendum)
Problem: Adult Inpatient Plan of Care  Goal: Patient-Specific Goal (Individualized)  Outcome: Ongoing (see interventions/notes)  Flowsheets (Taken 10/27/2021 1954)  Individualized Care Needs: no  Anxieties, Fears or Concerns: no  Patient-Specific Goals (Include Timeframe): be stronger to walk     Problem: Tissue Perfusion Altered  Goal: Improved Tissue Perfusion  Outcome: Ongoing (see interventions/notes)     Problem: Mobility Impairment  Goal: Optimal Mobility Independence and Safety  Outcome: Ongoing (see interventions/notes)   Hospitalist to unit to eval tele rhythm, EKG ordered. 2nd degree block noted. No complaints from pt overnight, he has been up to BR several times with standby assist, asymptomatic bradycardia. HR low 30's while sleeping, up to 40's & 50's at other times.

## 2021-10-27 NOTE — H&P (Signed)
[facility]  Admission H&P        Date of Service:  10/27/2021  Tanner [name] [id number] y.o. male  Date of Admission:  10/27/2021  Date of Birth:  22-Jun-1940      Chief Complaint:  Low heart rate    HPI: Tanner [name] is a 81 y.o., White male who presents with bradycardia.  He is a past medical history significant for hypothyroidism, lung cancer, status post lobectomy, coronary artery disease, status post mitral valve repair and CABG, hypertension, hyperlipidemia who presented to the emergency room after bradycardia was incidentally discovered while he was undergoing evaluation for procedure.  He denies any previous knowledge of low heart rate.  He does note that he is had some shortness of breath that has been gradually worsening over the last 2 years.  He states that he had an echocardiogram, but notes that it was over a year ago.  He also notes that he is had some lower extremity swelling, which he has had for at least a year and notes that he takes a "water pill" for it.  He denies any notable recent change in his symptoms.  He denies any chest pain, chest pressure, jaw pain, arm pain, worsening shortness of breath.  He does note that he has difficulty with breathing with mild exertion.  He notes that he is unable to walk up a flight of stairs or walk a complete block on flat ground without feeling short of breath.  His son is in the room and assists in providing history.  His son notes that he notices his father becomes short of breath after walking maybe 100 ft. He denies any paroxysmal nocturnal dyspnea.    History:    Past Medical:    Past Medical History:   Diagnosis Date    Disorder of thyroid     Esophageal reflux     Heart disease     Hx of thyroid cancer     Hyperlipidemia     Hypertension     Lung cancer (CMS HCC)      Past Surgical:    Past Surgical History:   Procedure Laterality Date    HX APPENDECTOMY      HX HEART SURGERY      open heart x2    HX MITRAL VALVE REPAIR      HX THYROIDECTOMY        Family:    Family Medical History:       Problem Relation (Age of Onset)    Cancer Mother    Heart Attack Father          Social:   reports that he quit smoking about 20 years ago. His smoking use included cigarettes. He started smoking about 68 years ago. He has a 48.00 pack-year smoking history. He has never used smokeless tobacco. He reports that he does not drink alcohol and does not use drugs.    Allergies   Allergen Reactions    Sulfa (Sulfonamides) Rash and Nausea/ Vomiting     Medications Prior to Admission       Prescriptions    albuterol sulfate (PROVENTIL OR VENTOLIN OR PROAIR) 90 mcg/actuation Inhalation oral inhaler    Take 2 Puffs by inhalation Once a day    aspirin 81 mg Oral Tablet, Chewable    Chew 1 Tablet (81 mg total) Once a day    atorvastatin (LIPITOR) 80 mg Oral Tablet    Take 1 Tablet (80 mg total) by  mouth Every night    bromocriptine (PARLODEL) 2.5 mg Oral Tablet    Take 1 Tablet (2.5 mg total) by mouth Once a day    calcium carbonate-vitamin D3 600 mg-10 mcg (400 unit) Oral Tablet    Take 2 Tablets by mouth Once a day    diphenhydrAMINE (BENADRYL) 50 mg Oral Capsule    Take 1 Capsule (50 mg total) by mouth Every night    fluticasone propion-salmeteroL (ADVAIR) 500-50 mcg/dose Inhalation oral diskus inhaler    Take 1 Puff (1 INHALATION  total) by inhalation Twice daily    ibuprofen (MOTRIN) 400 mg Oral Tablet    Take 1 Tablet (400 mg total) by mouth Twice per day as needed    ketotifen fumarate (EYE ITCH RELIEF) 0.025 % (0.035 %) Ophthalmic Drops    Instill 1 Drop into both eyes Twice daily    levothyroxine (SYNTHROID) 100 mcg Oral Tablet    Take 1 Tablet (100 mcg total) by mouth Once a day    losartan (COZAAR) 50 mg Oral Tablet    Take 0.5 Tablets (25 mg total) by mouth Once a day    omeprazole (PRILOSEC) 20 mg Oral Capsule, Delayed Release(E.C.)    Take 1 Capsule (20 mg total) by mouth Once a day    tamsulosin (FLOMAX) 0.4 mg Oral Capsule    Take 1 Capsule (0.4 mg total) by mouth  Every evening after dinner    torsemide (DEMADEX) 20 mg Oral Tablet    Take 0.5 Tablets (10 mg total) by mouth Once a day          No current facility-administered medications for this encounter.      ROS:   A 14 point review of systems is negative except for that mentioned above.       Exam:  Vitals:    10/27/21 1525 10/27/21 1600 10/27/21 1630   BP: (!) 141/61 (!) 134/96 (!) 145/73   Pulse: 40 56 (!) 37   Resp: 20 19 16    Temp: 36.8 C (98.2 F)     SpO2: 98% 98% 98%           No intake or output data in the 24 hours ending 10/27/21 1832    Physical Exam:   General:  81 year old man sitting up in bed, appropriately communicative and responsive.  In no apparent distress.    HEENT:  Normocephalic, atraumatic, extraocular muscles intact.  Moist oral mucosa.    Neck:  Supple, no lymphadenopathy, no thyromegaly, trachea midline.    Cardiovascular:  Bradycardic at about 40 beats per minute, normal rhythm. Palpable radial pulses bilaterally.  Decreased dorsalis pedis pulses bilaterally.    Respiratory:  Good inspiratory effort.  No respiratory distress, normal air entry bilaterally. no wheezes, no rales, no rhonchi.  Abdomen:  Soft, non-tender, normoactive bowel sounds, no guarding no hepatomegaly, no splenomegaly, no ascites.    Extremities:  Palpable pulses bilaterally, +1 pitting edema, free range of motion in all extremities bilaterally, no clubbing.  Skin:  Warm, dry, no ecchymosis, no petechiae, no jaundice, no lesions.  No pallor.  No cyanosis. Well healed mid sternal surgical scar  Neurological:  Awake, alert, oriented x3, no focal neurological deficits.  Psychiatric:  Normal affect, appropriate mood.      Labs:     Results for orders placed or performed during the hospital encounter of 10/27/21 (from the past 24 hour(s))   CBC/DIFF    Narrative    The following orders were created for  panel order CBC/DIFF.  Procedure                               Abnormality         Status                     ---------                                -----------         ------                     CBC WITH BTDH[[id number]]                Abnormal            Final result                 Please view results for these tests on the individual orders.   COMPREHENSIVE METABOLIC PANEL, NON-FASTING   Result Value Ref Range    SODIUM 143 136 - 145 mmol/L    POTASSIUM 4.2 3.5 - 5.1 mmol/L    CHLORIDE 111 (H) 98 - 107 mmol/L    CO2 TOTAL 26 21 - 31 mmol/L    ANION GAP 6 (L) 10 - 20 mmol/L    BUN 13 7 - 25 mg/dL    CREATININE 1.04 0.60 - 1.30 mg/dL    BUN/CREA RATIO 13 6 - 22    ESTIMATED GFR 72 >59 mL/min/1.48m2    ALBUMIN 3.6 3.5 - 5.7 g/dL    CALCIUM 8.9 8.6 - 10.3 mg/dL    GLUCOSE 83 74 - 109 mg/dL    ALKALINE PHOSPHATASE 91 34 - 104 U/L    ALT (SGPT) 10 7 - 52 U/L    AST (SGOT) 14 13 - 39 U/L    BILIRUBIN TOTAL 0.8 0.3 - 1.2 mg/dL    PROTEIN TOTAL 6.1 (L) 6.4 - 8.9 g/dL    ALBUMIN/GLOBULIN RATIO 1.4 0.8 - 1.4    OSMOLALITY, CALCULATED 284 270 - 290 mOsm/kg    CALCIUM, CORRECTED 9.3 8.9 - 10.8 mg/dL    GLOBULIN 2.5 (L) 2.9 - 5.4    Narrative    Estimated Glomerular Filtration Rate (eGFR) is calculated using the CKD-EPI (2021) equation, intended for patients 127years of age and older. If gender is not documented or "unknown", there will be no eGFR calculation.   TROPONIN-I NOW   Result Value Ref Range    TROPONIN I 11 <20 ng/L   B-TYPE NATRIURETIC PEPTIDE   Result Value Ref Range    BNP 366 (H) 5 - 100 pg/mL    Narrative                                Class 1: 101-250 pg/mL                              Class 2: 251-550 pg/mL                              Class 3: 551-900 pg/mL  Class 4: >901 pg/mL     The [employer] Heart Association has developed a four-stage functional classification system for CHF that is based on a subjective interpretation of the severity of a patient's clinical signs and symptoms.    Class 1 - Patients have no limitations on physical activity and have no symptoms with ordinary physical activity.    Class  2 - Patients have a slight limitation of physical activity and have symptoms with ordinary physical activity.    Class 3 - Patients have a marked limitation of physical activity and have symptoms with less than ordinary physical activity, but not at rest.    Class 4 - Patients are unable to perform any physical activity without discomfort.   MAGNESIUM   Result Value Ref Range    MAGNESIUM 2.0 1.9 - 2.7 mg/dL   THYROID STIMULATING HORMONE (SENSITIVE TSH)   Result Value Ref Range    TSH 2.075 0.450 - 5.330 uIU/mL   CBC WITH DIFF   Result Value Ref Range    WBCS UNCORRECTED 5.2 x10^3/uL    WBC 5.2 4.0 - 10.5 x10^3/uL    RBC 4.31 4.20 - 6.00 x10^6/uL    HGB 13.1 (L) 13.5 - 18.0 g/dL    HCT 39.1 (L) 42.0 - 51.0 %    MCV 90.8 78.0 - 99.0 fL    MCH 30.4 27.0 - 32.0 pg    MCHC 33.5 32.0 - 36.0 g/dL    RDW 14.4 11.6 - 14.8 %    PLATELETS 152 140 - 440 x10^3/uL    MPV 8.0 7.4 - 10.4 fL    NEUTROPHIL % 64 40 - 76 %    LYMPHOCYTE % 26 25 - 45 %    MONOCYTE % 8 0 - 12 %    EOSINOPHIL % 3 0 - 7 %    BASOPHIL % 1 0 - 3 %    NEUTROPHIL # 3.30 1.80 - 8.40 x10^3/uL    LYMPHOCYTE # 1.30 1.10 - 5.00 x10^3/uL    MONOCYTE # 0.40 0.00 - 1.30 x10^3/uL    EOSINOPHIL # 0.10 0.00 - 0.80 x10^3/uL    BASOPHIL # 0.00 0.00 - 0.30 x10^3/uL   Results for orders placed or performed during the hospital encounter of 10/27/21 (from the past 24 hour(s))   PLATELET FUNCTION COLLAGEN    Result Value Ref Range    PFA COLLAGEN/EPI RESULT 115 84 - 152 seconds    Narrative    AN ELEVATED EPI PAIRED WITH A NORMAL ADP COULD BE A RESULT OF MEDICATION USE, SUCH AS ASPIRIN, THAT TEMPORARILY REDUCES PLATELET FUNCTION. ALSO, HCTs OF <  35%  AND PLATELET COUNTS OF < 150,000 K/uL, MAY PRODUCE ELEVATED CLOSURE TIMES.          Imaging Studies:    XR AP MOBILE CHEST   Final Result      1. Hazy right basilar and left perihilar opacities new from October 03, 2018.   2. Stable postsurgical cardiomegaly with background findings suggestive of underlying COPD.         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 His symptoms appear to be a gradually evolving manifestation of his chronic heart disease. His heart rate was incidentally found.  However, given that his heart rate has decreased into the low 30s, prudent to monitor at least for now, especially in light of his home beta blocker use. Also, I suspect he has evolving congestive heart failure given his shortness of breath on exertion and lower extremity swelling with his extensive cardiac surgical history.    Discontinue beta blocker  Cardiology consulted. Will follow up on recommendations  Telemetry monitoring.  Echocardiogram  Home medications reconciled  Follow up on AM labs. Adjust management per clinical course.    I suspect he will require less than 2 midnights stay for management.    DVT/PE Prophylaxis: Lovenox    Disposition Planning: Home    Lacinda Axon, MD    This note was partially generated using MModal Fluency Direct system, and there may be some incorrect words, spellings, and punctuation that were not noted in checking the note before saving.

## 2021-10-27 NOTE — ED Provider Notes (Signed)
[facility]  ED Primary Provider Note  History of Present Illness   Chief Complaint   Patient presents with    Slow Heart Rate     Arrival: The patient arrived by Other    Tanner [name] is a 81 y.o. male who had concerns including Slow Heart Rate.  81 year old male with past medical history of CAD, lung CA, thyroid disorder, parathyroid d/o presents to the emergency room for evaluation of bradycardia that was noticed while the patient was in for a procedure prior to arrival.  He was sent here for further evaluation.  Patient states he has no known history of similar signs and symptoms and was recently seen at the [facility] for a regular appointment.  Patient denies chest pain.  He states he does have some shortness of breath and at times he has some visual changes.  Patient states he is seeing Neurology for a heaviness in his legs.  He denies pain at present.    Review of Systems   All other systems reviewed and are negative except as noted.    Historical Data   History Reviewed This Encounter: Medical History  Surgical History  Family History  Social History    Physical Exam   ED Triage Vitals [10/27/21 1525]   BP (Non-Invasive) (!) 141/61   Heart Rate 40   Respiratory Rate 20   Temperature 36.8 C (98.2 F)   SpO2 98 %   Weight    Height        Constitutional:  81 y.o. male who appears in no distress. Normal color, no cyanosis.   HENT:   Head: Normocephalic and atraumatic.   Mouth/Throat: Oropharynx is clear and moist.   Eyes: EOMI, PERRL   Neck: Trachea midline. Neck supple.  Cardiovascular:  Bradycardia, regular. S1, S2.  No murmurs, rubs or gallops. Intact distal pulses.  Pulmonary/Chest: BS clear and equal bilaterally. No respiratory distress. No wheezes, rales or chest tenderness.   Abdominal: Bowel sounds present and normal. Abdomen soft, no tenderness, no rebound and no guarding.  Back: No midline spinal tenderness, no paraspinal tenderness,  no CVA tenderness.           Musculoskeletal: No enderness or deformity. Trace edema BLE.   Skin: warm and dry. No rash, erythema, pallor or cyanosis  Psychiatric: normal mood and affect. Behavior is normal.   Neurological: Patient keenly alert and responsive, easily able to raise eyebrows, facial muscles/expressions symmetric, speaking in fluent sentences, moving all extremities equally and fully, normal gait  Patient Data     Labs Ordered/Reviewed   COMPREHENSIVE METABOLIC PANEL, NON-FASTING - Abnormal; Notable for the following components:       Result Value    CHLORIDE 111 (*)     ANION GAP 6 (*)     PROTEIN TOTAL 6.1 (*)     GLOBULIN 2.5 (*)     All other components within normal limits    Narrative:     Estimated Glomerular Filtration Rate (eGFR) is calculated using the CKD-EPI (2021) equation, intended for patients 15 years of age and older. If gender is not documented or "unknown", there will be no eGFR calculation.   B-TYPE NATRIURETIC PEPTIDE - Abnormal; Notable for the following components:    BNP 366 (*)     All other components within normal limits    Narrative:  Class 1: 101-250 pg/mL                              Class 2: 251-550 pg/mL                              Class 3: 551-900 pg/mL                              Class 4: >901 pg/mL     The [place] [employer] Association has developed a four-stage functional classification system for CHF that is based on a subjective interpretation of the severity of a patient's clinical signs and symptoms.    Class 1 - Patients have no limitations on physical activity and have no symptoms with ordinary physical activity.    Class 2 - Patients have a slight limitation of physical activity and have symptoms with ordinary physical activity.    Class 3 - Patients have a marked limitation of physical activity and have symptoms with less than ordinary physical activity, but not at rest.    Class 4 - Patients are unable to perform any physical  activity without discomfort.   CBC WITH DIFF - Abnormal; Notable for the following components:    HGB 13.1 (*)     HCT 39.1 (*)     All other components within normal limits   TROPONIN-I - Normal   MAGNESIUM - Normal   THYROID STIMULATING HORMONE (SENSITIVE TSH) - Normal   CBC/DIFF    Narrative:     The following orders were created for panel order CBC/DIFF.  Procedure                               Abnormality         Status                     ---------                               -----------         ------                     CBC WITH HXTA[[id number]]                Abnormal            Final result                 Please view results for these tests on the individual orders.   TROPONIN-I   TROPONIN-I     XR AP MOBILE CHEST   Final Result by Edi, Radresults In (08/01 1632)      1. Hazy right basilar and left perihilar opacities new from October 03, 2018.   2. Stable postsurgical cardiomegaly with background findings suggestive of underlying COPD.         Radiologist location ID: [place] Decision Making  Plan was to admit the patient for evaluation.  Labs show no significant findings.  Dr. Pearline Cables did come down to evaluate the patient.  His EKG did show new  a heart rate of 36 beats per minute, it did improve with glucagon.  I spoke to the cardiologist on-call. Dr Hessie Dibble, who feels the patient can be admitted here and observed, he states to hold the beta-blocker.  He also states to hold the glucose gone at this time.  I did relay this to Dr. Pearline Cables and she is evaluate the patient for admit.     Amount and/or Complexity of Data Reviewed  Labs: ordered. Decision-making details documented in ED Course.  Radiology: ordered. Decision-making details documented in ED Course.  ECG/medicine tests: ordered.    Risk  Prescription drug management.  Decision regarding hospitalization.    Critical Care  Total time providing critical care: 0 minutes    ED Course as of 10/27/21 1813   Tue  Oct 27, 2021   1644 EKG shows 36 BMP. Glucagon ordered.    1728 Notified Operator to page hospitalist for admit.    [place] and spoke to Dr Hessie Dibble and discussed finding, treatment, and history. He states pt does not need to be transferred. He feels pt can be monitored here, hold the beta blocker. BP is stable. Recheck of pt. He states he feels good.    [place] Dr Pearline Cables did come to the ER, She is going to evaluate pt.          Medications Administered in the ED   glucagon [facility] DIAGNOSTIC KIT) injection 3 mg (3 mg Intravenous Given 10/27/21 1647)     Clinical Impression   Bradycardia (Primary)   Weakness       Disposition: Admitted

## 2021-10-27 NOTE — ED Triage Notes (Signed)
BRADYCARDIA ON ARRIVAL FOR RENAL BIOPSY TODAY. PT ALSO HAS HX OF LUNG CA. 22G RIGHT HAND.

## 2021-10-27 NOTE — ED Nurses Note (Signed)
Patient denies pain, nausea, SOB at this time. Spouse at bedside. Patient hooked up to monitoring including BP, pulse and cardiac.

## 2021-10-28 ENCOUNTER — Observation Stay (HOSPITAL_COMMUNITY): Payer: 59

## 2021-10-28 ENCOUNTER — Encounter (HOSPITAL_COMMUNITY): Payer: Self-pay | Admitting: Internal Medicine

## 2021-10-28 DIAGNOSIS — I495 Sick sinus syndrome: Principal | ICD-10-CM

## 2021-10-28 DIAGNOSIS — I5032 Chronic diastolic (congestive) heart failure: Secondary | ICD-10-CM | POA: Diagnosis present

## 2021-10-28 LAB — ECG 12 LEAD
Atrial Rate: 57 {beats}/min
Atrial Rate: 37 {beats}/min
Calculated P Axis: -23 degrees
Calculated P Axis: -13 degrees
Calculated R Axis: 23 degrees
Calculated R Axis: 41 degrees
Calculated R Axis: 13 degrees
Calculated T Axis: 18 degrees
Calculated T Axis: 10 degrees
Calculated T Axis: 17 degrees
PR Interval: 248 ms
QRS Duration: 162 ms
QRS Duration: 148 ms
QRS Duration: 164 ms
QT Interval: 544 ms
QT Interval: 532 ms
QT Interval: 560 ms
QTC Calculation: 420 ms
QTC Calculation: 417 ms
QTC Calculation: 439 ms
Ventricular rate: 36 {beats}/min
Ventricular rate: 37 {beats}/min
Ventricular rate: 37 {beats}/min

## 2021-10-28 LAB — CBC WITH DIFF
BASOPHIL #: 0 10*3/uL (ref 0.00–0.30)
BASOPHIL %: 0 % (ref 0–3)
EOSINOPHIL #: 0.2 10*3/uL (ref 0.00–0.80)
EOSINOPHIL %: 3 % (ref 0–7)
HCT: 35.9 % — ABNORMAL LOW (ref 42.0–51.0)
HGB: 12.4 g/dL — ABNORMAL LOW (ref 13.5–18.0)
LYMPHOCYTE #: 1.5 10*3/uL (ref 1.10–5.00)
LYMPHOCYTE %: 24 % — ABNORMAL LOW (ref 25–45)
MCH: 31.3 pg (ref 27.0–32.0)
MCHC: 34.6 g/dL (ref 32.0–36.0)
MCV: 90.6 fL (ref 78.0–99.0)
MONOCYTE #: 0.5 10*3/uL (ref 0.00–1.30)
MONOCYTE %: 8 % (ref 0–12)
MPV: 8.8 fL (ref 7.4–10.4)
NEUTROPHIL #: 4.2 10*3/uL (ref 1.80–8.40)
NEUTROPHIL %: 65 % (ref 40–76)
PLATELETS: 151 10*3/uL (ref 140–440)
RBC: 3.97 10*6/uL — ABNORMAL LOW (ref 4.20–6.00)
RDW: 14.9 % — ABNORMAL HIGH (ref 11.6–14.8)
WBC: 6.4 10*3/uL (ref 4.0–10.5)
WBCS UNCORRECTED: 6.4 10*3/uL

## 2021-10-28 LAB — BASIC METABOLIC PANEL
ANION GAP: 7 mmol/L — ABNORMAL LOW (ref 10–20)
BUN/CREA RATIO: 15 (ref 6–22)
BUN: 15 mg/dL (ref 7–25)
CALCIUM: 8.4 mg/dL — ABNORMAL LOW (ref 8.6–10.3)
CHLORIDE: 111 mmol/L — ABNORMAL HIGH (ref 98–107)
CO2 TOTAL: 24 mmol/L (ref 21–31)
CREATININE: 0.97 mg/dL (ref 0.60–1.30)
ESTIMATED GFR: 78 mL/min/{1.73_m2} (ref >59)
GLUCOSE: 87 mg/dL (ref 74–109)
OSMOLALITY, CALCULATED: 283 mOsm/kg (ref 270–290)
POTASSIUM: 3.9 mmol/L (ref 3.5–5.1)
SODIUM: 142 mmol/L (ref 136–145)

## 2021-10-28 LAB — MAGNESIUM: MAGNESIUM: 2 mg/dL (ref 1.9–2.7)

## 2021-10-28 MED ORDER — SODIUM CHLORIDE 0.9 % INJECTION SOLUTION
2.0000 mL | Freq: Once | INTRAVENOUS | Status: DC | PRN
Start: 2021-10-28 — End: 2021-10-30
  Administered 2021-10-28: 4 mL via INTRAVENOUS

## 2021-10-28 NOTE — Progress Notes (Signed)
[facility]  Progress Note    Tanner [name]  Date of service: 10/28/2021  Date of Admission:  10/27/2021  Hospital Day:  LOS: 0 days     Subjective: Tanner [name] is an 81 year old man who came in with bradycardia in the 30s.     He was seen by Cardiology today who determined he will need a pacemaker placed.    He states that he is feeling fine. Denies any symptoms.      Objective:      Vital Signs:  Vitals:    10/28/21 1350 10/28/21 1904 10/28/21 2106 10/28/21 2246   BP: (!) 160/82 (!) 144/84     Pulse: 59 68 51 59   Resp: 16 16     Temp: 36.6 C (97.9 F) 36.6 C (97.8 F)     SpO2: 96% 94%     Weight:       Height:       BMI:                I/O:  I/O last 24 hours:    Intake/Output Summary (Last 24 hours) at 10/28/2021 2354  Last data filed at 10/28/2021 1811  [name] per 24 hour   Intake 720 ml   Output --   Net 720 ml         acetaminophen (TYLENOL) tablet, 650 mg, Oral, Q4H PRN  aluminum-magnesium hydroxide-simethicone (MAG-AL PLUS) 200-200-20 mg per 5 mL oral liquid, 10 mL, Oral, Q4H PRN  aspirin chewable tablet 81 mg, 81 mg, Oral, Daily  atorvastatin (LIPITOR) tablet, 80 mg, Oral, NIGHTLY  bromocriptine (PARLODEL) tablet, 2.5 mg, Oral, Daily  diphenhydrAMINE (BENADRYL) capsule, 50 mg, Oral, NIGHTLY  docusate sodium (COLACE) capsule, 100 mg, Oral, 2x/day PRN  enoxaparin PF (LOVENOX) 40 mg/0.4 mL SubQ injection, 40 mg, Subcutaneous, Q24H  ipratropium-albuterol 0.5 mg-3 mg(2.5 mg base)/3 mL Solution for Nebulization, 3 mL, Nebulization, Q4H PRN  levothyroxine (SYNTHROID) tablet, 100 mcg, Oral, Daily  losartan (COZAAR) tablet, 25 mg, Oral, Daily  perflutren lipid microspheres (DEFINITY) 1.3 mL in NS 10 mL (tot vol) injection, 2 mL, Intravenous, Cardiology Once PRN  tamsulosin (FLOMAX) capsule, 0.4 mg, Oral, Daily after Dinner  torsemide (DEMADEX) tablet, 10 mg, Oral, Daily        Physical Exam:  General:  81 year old man sitting up in bed, appropriately communicative and responsive.  In no apparent  distress.    HEENT:  Normocephalic, atraumatic, extraocular muscles intact.  Moist oral mucosa.    Neck:  Supple, no lymphadenopathy, no thyromegaly, trachea midline.    Cardiovascular:  Bradycardic at about 50 beats per minute, normal rhythm. Palpable radial pulses bilaterally.  Decreased dorsalis pedis pulses bilaterally.    Respiratory:  Good inspiratory effort.  No respiratory distress, normal air entry bilaterally. no wheezes, no rales, no rhonchi.  Abdomen:  Soft, non-tender, normoactive bowel sounds, no guarding no hepatomegaly, no splenomegaly, no ascites.    Extremities:  Palpable pulses bilaterally, +1 pitting edema, free range of motion in all extremities bilaterally, no clubbing.  Skin:  Warm, dry, no ecchymosis, no petechiae, no jaundice, no lesions.  No pallor.  No cyanosis. Well healed mid sternal surgical scar  Neurological:  Awake, alert, oriented x3, no focal neurological deficits.  Psychiatric:  Normal affect, appropriate mood.      Labs:  Results for orders placed or performed during the hospital encounter of 10/27/21 (from the past 24 hour(s))   BASIC METABOLIC PANEL, NON-FASTING   Result Value  Ref Range    SODIUM 142 136 - 145 mmol/L    POTASSIUM 3.9 3.5 - 5.1 mmol/L    CHLORIDE 111 (H) 98 - 107 mmol/L    CO2 TOTAL 24 21 - 31 mmol/L    ANION GAP 7 (L) 10 - 20 mmol/L    CALCIUM 8.4 (L) 8.6 - 10.3 mg/dL    GLUCOSE 87 74 - 109 mg/dL    BUN 15 7 - 25 mg/dL    CREATININE 0.97 0.60 - 1.30 mg/dL    BUN/CREA RATIO 15 6 - 22    ESTIMATED GFR 78 >59 mL/min/1.69m2    OSMOLALITY, CALCULATED 283 270 - 290 mOsm/kg    Narrative    Estimated Glomerular Filtration Rate (eGFR) is calculated using the CKD-EPI (2021) equation, intended for patients 152years of age and older. If gender is not documented or "unknown", there will be no eGFR calculation.   MAGNESIUM   Result Value Ref Range    MAGNESIUM 2.0 1.9 - 2.7 mg/dL   CBC/DIFF    Narrative    The following orders were created for panel order  CBC/DIFF.  Procedure                               Abnormality         Status                     ---------                               -----------         ------                     CBC WITH DMLYY[[id number]]               Abnormal            Final result                 Please view results for these tests on the individual orders.   CBC WITH DIFF   Result Value Ref Range    WBCS UNCORRECTED 6.4 x10^3/uL    WBC 6.4 4.0 - 10.5 x10^3/uL    RBC 3.97 (L) 4.20 - 6.00 x10^6/uL    HGB 12.4 (L) 13.5 - 18.0 g/dL    HCT 35.9 (L) 42.0 - 51.0 %    MCV 90.6 78.0 - 99.0 fL    MCH 31.3 27.0 - 32.0 pg    MCHC 34.6 32.0 - 36.0 g/dL    RDW 14.9 (H) 11.6 - 14.8 %    PLATELETS 151 140 - 440 x10^3/uL    MPV 8.8 7.4 - 10.4 fL    NEUTROPHIL % 65 40 - 76 %    LYMPHOCYTE % 24 (L) 25 - 45 %    MONOCYTE % 8 0 - 12 %    EOSINOPHIL % 3 0 - 7 %    BASOPHIL % 0 0 - 3 %    NEUTROPHIL # 4.20 1.80 - 8.40 x10^3/uL    LYMPHOCYTE # 1.50 1.10 - 5.00 x10^3/uL    MONOCYTE # 0.50 0.00 - 1.30 x10^3/uL    EOSINOPHIL # 0.20 0.00 - 0.80 x10^3/uL    BASOPHIL # 0.00 0.00 - 0.30 x10^3/uL            Imaging:  Microbiology:  No results found for any visits on 10/27/21 (from the past 96 hour(s)).        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 Appreciate input. Determined that Tanner [name] will need pacemaker. Will follow up  Echo cardiogram done - results pending. Will follow up on results.  Continue beta blocker per cardiology.    MY ORDERS LAST 24 (24h ago, onward)       Start     Ordered    10/29/21 7782  BASIC METABOLIC PANEL  4235 - AM DRAW (labs only)         10/28/21 2354    10/29/21 0530  MAGNESIUM  0530 - AM DRAW (labs only)         10/28/21 2354    10/28/21 1430  PATIENT CLASS/LEVEL OF CARE DESIGNATION - PRN  ONE TIME         10/28/21 1428     10/28/21 1033  perflutren lipid microspheres (DEFINITY) 1.3 mL in NS 10 mL (tot vol) injection  CARDIOLOGY ONCE PRN         10/28/21 1033    10/28/21 0530  CBC WITH DIFF  PROCEDURE ONCE         10/28/21 0017    10/28/21 0100  FULL CODE  CONTINUOUS         10/28/21 0053    10/28/21 0100  UPDATE PATIENT - (SERVICE OR ATTENDING) - PRN  ONE TIME         10/28/21 0053    10/28/21 0100  TRANSTHORACIC ECHOCARDIOGRAM - ADULT  ONE TIME         10/28/21 0056                    Disposition Planning:   home    Lacinda Axon, [place]    This note was partially generated using MModal Fluency Direct system, and there may be some incorrect words, spellings, and punctuation that were not noted in checking the note before saving.

## 2021-10-28 NOTE — Care Management Notes (Signed)
[place] Management Initial Evaluation    Patient Name: Tanner [name]  Date of Birth: [birth year]  Sex: male  Date/Time of Admission: 10/27/2021  3:24 PM  Room/Bed: 363/A  Payor: [place] / Plan: Evette Doffing [employer] / Product Type: Managed Care /   Primary Care Providers:  Raye Sorrow, MD, MD (General)    Pharmacy Info:   Preferred [place], [place] - [address]    [address] [place] [address]    Phone: [phone] Fax: [phone]    Hours: Not open 24 hours          Emergency Contact Info:   Extended Emergency Contact Information  Primary Emergency Contact: [facility]  Address: [address]           [place], [place] [address] Johnnette Litter of [place] Phone: [phone]  Work Phone: [phone]  Mobile Phone: [phone]  Relation: None    History:   Tanner [name] is a 81 y.o., male, admitted 10/27/2021    Height/Weight: 182.9 cm (6') / 94.8 kg (209 lb 1.6 oz)     LOS: 0 days   Admitting Diagnosis: Bradycardia [R00.1]    Assessment:      10/28/21 0805   Assessment Details   Assessment Type Admission   Date of Care Management Update 10/28/21   Readmission   Is this a readmission? No   Insurance Information/Type   Insurance type   [facility], Part A Medicare)   Employment/Financial   Patient has Prescription Coverage?  Yes        Name of Insurance Coverage for Medications [facility]   Financial Concerns none   Living Environment   Select an age group to open "lives with" row.  Adult   Lives With spouse   Living Arrangements mobile home   Able to Return to Prior Arrangements yes   Living Arrangement Comments Lives with wife, [place] Assessment: No Problems Identified   Home Accessibility bed and bath on same level;stairs (1 railing present)   Home Safety Comments No safety concerns voiced by patient   Custody and Legal Status   Do you have a court appointed guardian/conservator? No   Are you an  emancipated minor? No   Custody Issues? No   Paternity Affidavit Requested? No   Care Management Plan   Discharge Planning Status initial meeting   Projected Discharge Date 10/30/21   Discharge plan discussed with: Patient   CM will evaluate for rehabilitation potential yes   Discharge Needs Assessment   Equipment Currently Used at Home cane, straight   Equipment Needed After Discharge none   Discharge Facility/Level of Care Needs Home (Patient/Family Member/other)(code 1)   Transportation Available car;family or friend will provide   Referral Information   Admission Type observation   Arrived From home or self-care     Discharge Plan:  Home (Patient/Family Member/other) (code 1)    Admit Dx: Bradycardia    On contact this am, patient alert and oriented X 3.  Patient reports living at physical address [address]/[place] with wife, Tanner [name]. Patient denies use of home Oxygen or other Respiratory Therapies prior to admission.  Patient denies Formal Supports in home and reports having "good" informal support with wife. Patient denies use of assistive device(s) for ambulation but this CM noted a Cane at bedside to which patient reports using a  Cane "sometimes" to steady gait. Patient denies decline in physical functioning. Patient obtains Rx meds and healthcare through [facility].  Patient in process of moving primary healthcare from [facility] to [facility] with Dr. Tomasita Crumble.  Patient denies barriers to healthcare and pharmacy access. Patient self transports and when unable, wife provides transport. Patient reports living in [facility] with 4 stairs/railing to enter/exit home. Patient denies this to be a barrier/concern for discharge.  DC Plan to home.  No DC needs identified at this time.   CM will continue to follow.    The patient will continue to be evaluated for developing discharge needs.     Case Manager: Freddrick March, MSW  Phone: [phone]

## 2021-10-28 NOTE — Consults (Signed)
[place]    Date of Service:  10/28/2021  Tanner [name]   81 y.o. male  Date of Admission:  10/27/2021  Date of Birth:  1940/07/19  0     Reason for Consultation:  Bradycardia with slow atrial fibrillation and slow sinus bradycardia and complete right bundle-branch block    Problem List:  Active Hospital Problems   (*Primary Problem)    Diagnosis    *Bradycardia    Congestive heart failure of unknown etiology (CMS [place])    Hypertension    Coronary artery disease       History of Present Illness:  Tanner [name] is a 81 y.o. White male who presents with .  Bradycardia.  He gives history of dizziness but has not had any syncopal episodes.  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  There is history of lung cancer for which she has had lobectomy.  Denies any chest pain or dyspnea at this time but has had exertional dyspnea.  There is history hypertension hyperlipidemia.      History:    Past Medical:    Past Medical History:   Diagnosis Date    Disorder of thyroid     Esophageal reflux     Heart disease     Hx of thyroid cancer     Hyperlipidemia     Hypertension     Lung cancer (CMS HCC)       Past Surgical:    Past Surgical History:   Procedure Laterality Date    HX APPENDECTOMY      HX HEART SURGERY      open heart x2    HX MITRAL VALVE REPAIR      HX THYROIDECTOMY        Family:    Family Medical History:       Problem Relation (Age of Onset)    Cancer Mother    Heart Attack Father           Social:   reports that he quit smoking about 20 years ago. His smoking use included cigarettes. He started smoking about 68 years ago. He has a 48.00 pack-year smoking history. He has never used smokeless tobacco. He reports that he does not drink alcohol and does not use drugs.     REVIEW OF SYSTEMS:  All systems have been reviewed and found to be negative except for as stated above in the history of present  illness.       Constitutional - no appetite or weight changes, no fatigue, no fevers, chills, or night sweats  Respiratory has had dyspnea  Cardiovascular - exertional dyspnea  Gastrointestinal - no nausea, vomiting, diarrhea, or constipation, no dyspepsia  Skin - no rashes, color changes, or lesions  Musculoskeletal - no arthralgias or myalgias  Genitourinary - no urinary frequency or dysuria, no genital discharge  Neurologic - no vision or hearing changes, no weakness, no parasthesias   Psychiatric - mood has been appropriate      Allergies   Allergen Reactions    Sulfa (Sulfonamides) Rash and Nausea/ Vomiting       Medications:  Medications Prior to Admission       Prescriptions    albuterol sulfate (PROVENTIL OR VENTOLIN OR PROAIR) 90 mcg/actuation Inhalation oral inhaler    Take 2 Puffs by inhalation Once a day    aspirin 81 mg  Oral Tablet, Chewable    Chew 1 Tablet (81 mg total) Once a day    atorvastatin (LIPITOR) 80 mg Oral Tablet    Take 1 Tablet (80 mg total) by mouth Every night    bromocriptine (PARLODEL) 2.5 mg Oral Tablet    Take 1 Tablet (2.5 mg total) by mouth Once a day    calcium carbonate-vitamin D3 600 mg-10 mcg (400 unit) Oral Tablet    Take 2 Tablets by mouth Once a day    diphenhydrAMINE (BENADRYL) 50 mg Oral Capsule    Take 1 Capsule (50 mg total) by mouth Every night    fluticasone propion-salmeteroL (ADVAIR) 500-50 mcg/dose Inhalation oral diskus inhaler    Take 1 Puff (1 INHALATION  total) by inhalation Twice daily    ibuprofen (MOTRIN) 400 mg Oral Tablet    Take 1 Tablet (400 mg total) by mouth Twice per day as needed    ketotifen fumarate (EYE ITCH RELIEF) 0.025 % (0.035 %) Ophthalmic Drops    Instill 1 Drop into both eyes Twice daily    levothyroxine (SYNTHROID) 100 mcg Oral Tablet    Take 1 Tablet (100 mcg total) by mouth Once a day    losartan (COZAAR) 50 mg Oral Tablet    Take 0.5 Tablets (25 mg total) by mouth Once a day    omeprazole (PRILOSEC) 20 mg Oral Capsule, Delayed  Release(E.C.)    Take 1 Capsule (20 mg total) by mouth Once a day    tamsulosin (FLOMAX) 0.4 mg Oral Capsule    Take 1 Capsule (0.4 mg total) by mouth Every evening after dinner    torsemide (DEMADEX) 20 mg Oral Tablet    Take 0.5 Tablets (10 mg total) by mouth Once a day          acetaminophen (TYLENOL) tablet, 650 mg, Oral, Q4H PRN  aluminum-magnesium hydroxide-simethicone (MAG-AL PLUS) 200-200-20 mg per 5 mL oral liquid, 10 mL, Oral, Q4H PRN  aspirin chewable tablet 81 mg, 81 mg, Oral, Daily  atorvastatin (LIPITOR) tablet, 80 mg, Oral, NIGHTLY  bromocriptine (PARLODEL) tablet, 2.5 mg, Oral, Daily  diphenhydrAMINE (BENADRYL) capsule, 50 mg, Oral, NIGHTLY  docusate sodium (COLACE) capsule, 100 mg, Oral, 2x/day PRN  enoxaparin PF (LOVENOX) 40 mg/0.4 mL SubQ injection, 40 mg, Subcutaneous, Q24H  ipratropium-albuterol 0.5 mg-3 mg(2.5 mg base)/3 mL Solution for Nebulization, 3 mL, Nebulization, Q4H PRN  levothyroxine (SYNTHROID) tablet, 100 mcg, Oral, Daily  losartan (COZAAR) tablet, 25 mg, Oral, Daily  tamsulosin (FLOMAX) capsule, 0.4 mg, Oral, Daily after Dinner  torsemide (DEMADEX) tablet, 10 mg, Oral, Daily          Physical Exam:  General:  Not in any kind of distress.  He is alert and oriented  Cardiac: Regular rate and rhythm, no murmur auscultated.  Respiratory: Clear to auscultation bilaterally without wheeze.  Abdomen: Positive bowel sounds, soft, nontender  Extremities: No peripheral edema noted on exam.    Vitals:  Temperature: 36.6 C (97.9 F)  Heart Rate: 44  Respiratory Rate: 16  BP (Non-Invasive): 125/71  SpO2: 94 %    Nursing note and vitals reviewed.     Labs:     Results for orders placed or performed during the hospital encounter of 10/27/21 (from the past 24 hour(s))   COMPREHENSIVE METABOLIC PANEL, NON-FASTING   Result Value Ref Range    SODIUM 143 136 - 145 mmol/L    POTASSIUM 4.2 3.5 - 5.1 mmol/L    CHLORIDE 111 (H) 98 - 107 mmol/L  CO2 TOTAL 26 21 - 31 mmol/L    ANION GAP 6 (L) 10 - 20  mmol/L    BUN 13 7 - 25 mg/dL    CREATININE 1.04 0.60 - 1.30 mg/dL    BUN/CREA RATIO 13 6 - 22    ESTIMATED GFR 72 >59 mL/min/1.30m^2    ALBUMIN 3.6 3.5 - 5.7 g/dL    CALCIUM 8.9 8.6 - 10.3 mg/dL    GLUCOSE 83 74 - 109 mg/dL    ALKALINE PHOSPHATASE 91 34 - 104 U/L    ALT (SGPT) 10 7 - 52 U/L    AST (SGOT) 14 13 - 39 U/L    BILIRUBIN TOTAL 0.8 0.3 - 1.2 mg/dL    PROTEIN TOTAL 6.1 (L) 6.4 - 8.9 g/dL    ALBUMIN/GLOBULIN RATIO 1.4 0.8 - 1.4    OSMOLALITY, CALCULATED 284 270 - 290 mOsm/kg    CALCIUM, CORRECTED 9.3 8.9 - 10.8 mg/dL    GLOBULIN 2.5 (L) 2.9 - 5.4   TROPONIN-I NOW   Result Value Ref Range    TROPONIN I 11 <20 ng/L   B-TYPE NATRIURETIC PEPTIDE   Result Value Ref Range    BNP 366 (H) 5 - 100 pg/mL   MAGNESIUM   Result Value Ref Range    MAGNESIUM 2.0 1.9 - 2.7 mg/dL   THYROID STIMULATING HORMONE (SENSITIVE TSH)   Result Value Ref Range    TSH 2.075 0.450 - 5.330 uIU/mL   CBC WITH DIFF   Result Value Ref Range    WBCS UNCORRECTED 5.2 x10^3/uL    WBC 5.2 4.0 - 10.5 x10^3/uL    RBC 4.31 4.20 - 6.00 x10^6/uL    HGB 13.1 (L) 13.5 - 18.0 g/dL    HCT 39.1 (L) 42.0 - 51.0 %    MCV 90.8 78.0 - 99.0 fL    MCH 30.4 27.0 - 32.0 pg    MCHC 33.5 32.0 - 36.0 g/dL    RDW 14.4 11.6 - 14.8 %    PLATELETS 152 140 - 440 x10^3/uL    MPV 8.0 7.4 - 10.4 fL    NEUTROPHIL % 64 40 - 76 %    LYMPHOCYTE % 26 25 - 45 %    MONOCYTE % 8 0 - 12 %    EOSINOPHIL % 3 0 - 7 %    BASOPHIL % 1 0 - 3 %    NEUTROPHIL # 3.30 1.80 - 8.40 x10^3/uL    LYMPHOCYTE # 1.30 1.10 - 5.00 x10^3/uL    MONOCYTE # 0.40 0.00 - 1.30 x10^3/uL    EOSINOPHIL # 0.10 0.00 - 0.80 x10^3/uL    BASOPHIL # 0.00 0.00 - 0.30 x10^3/uL   ECG 12 LEAD   Result Value Ref Range    Ventricular rate 36 BPM    Atrial Rate 57 BPM    QRS Duration 162 ms    QT Interval 544 ms    QTC Calculation 420 ms    Calculated P Axis -23 degrees    Calculated R Axis 23 degrees    Calculated T Axis 18 degrees   TROPONIN-I IN ONE HOUR   Result Value Ref Range    TROPONIN I 13 <20 ng/L   ECG 12 LEAD -  TOMORROW   Result Value Ref Range    Ventricular rate 37 BPM    Atrial Rate 37 BPM    PR Interval 248 ms    QRS Duration 148 ms    QT Interval 532 ms  QTC Calculation 417 ms    Calculated P Axis -13 degrees    Calculated R Axis 41 degrees    Calculated T Axis 10 degrees   TROPONIN-I IN THREE HOURS   Result Value Ref Range    TROPONIN I 13 <20 ng/L   BASIC METABOLIC PANEL, NON-FASTING   Result Value Ref Range    SODIUM 142 136 - 145 mmol/L    POTASSIUM 3.9 3.5 - 5.1 mmol/L    CHLORIDE 111 (H) 98 - 107 mmol/L    CO2 TOTAL 24 21 - 31 mmol/L    ANION GAP 7 (L) 10 - 20 mmol/L    CALCIUM 8.4 (L) 8.6 - 10.3 mg/dL    GLUCOSE 87 74 - 109 mg/dL    BUN 15 7 - 25 mg/dL    CREATININE 0.97 0.60 - 1.30 mg/dL    BUN/CREA RATIO 15 6 - 22    ESTIMATED GFR 78 >59 mL/min/1.1m^2    OSMOLALITY, CALCULATED 283 270 - 290 mOsm/kg   MAGNESIUM   Result Value Ref Range    MAGNESIUM 2.0 1.9 - 2.7 mg/dL   CBC WITH DIFF   Result Value Ref Range    WBCS UNCORRECTED 6.4 x10^3/uL    WBC 6.4 4.0 - 10.5 x10^3/uL    RBC 3.97 (L) 4.20 - 6.00 x10^6/uL    HGB 12.4 (L) 13.5 - 18.0 g/dL    HCT 35.9 (L) 42.0 - 51.0 %    MCV 90.6 78.0 - 99.0 fL    MCH 31.3 27.0 - 32.0 pg    MCHC 34.6 32.0 - 36.0 g/dL    RDW 14.9 (H) 11.6 - 14.8 %    PLATELETS 151 140 - 440 x10^3/uL    MPV 8.8 7.4 - 10.4 fL    NEUTROPHIL % 65 40 - 76 %    LYMPHOCYTE % 24 (L) 25 - 45 %    MONOCYTE % 8 0 - 12 %    EOSINOPHIL % 3 0 - 7 %    BASOPHIL % 0 0 - 3 %    NEUTROPHIL # 4.20 1.80 - 8.40 x10^3/uL    LYMPHOCYTE # 1.50 1.10 - 5.00 x10^3/uL    MONOCYTE # 0.50 0.00 - 1.30 x10^3/uL    EOSINOPHIL # 0.20 0.00 - 0.80 x10^3/uL    BASOPHIL # 0.00 0.00 - 0.30 x10^3/uL       Diagnostic Tests   Reviewed:     No results found for this or any previous visit.   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 Sinus  bradycardia with periods of slow atrial fibrillation suggestive of sick sinus syndrome.  He also had an episode of supraventricular tachycardia today.  There is complete right bundle-branch block on EKG and this suggests diffuse conduction tissue disorder.      2. Coronary artery disease status post coronary artery bypass surgery 2017.      3. History of mitral valve repair    4. History of carcinoma of lungs status post lobectomy    5. Hypertension hyperlipidemia.            Plan:   Patient needs a pacemaker and since he has sinus bradycardia at this time a dual-chamber pacemaker would be  helpful.      We will keep him on a beta-blocker after the pacemaker insertion for his episodes of supraventricular tachycardia.      He will have echocardiogram today and would assess his left ventricular systolic function to see if he needs afterload reduction of some of the other treatments.      Thank you very much for asking me to take part in his management.    Hewitt Shorts, MD       This note was partially generated using MModal Fluency Direct system, and there may be some incorrect words, spellings, and punctuation that were not noted in checking the note before saving.

## 2021-10-29 ENCOUNTER — Inpatient Hospital Stay (HOSPITAL_COMMUNITY): Payer: 59 | Admitting: Anesthesiology

## 2021-10-29 ENCOUNTER — Encounter (HOSPITAL_COMMUNITY)
Admission: EM | Disposition: A | Discharge status: Home / Self Care | Payer: Self-pay | Source: Home / Self Care | Attending: Internal Medicine

## 2021-10-29 ENCOUNTER — Inpatient Hospital Stay (HOSPITAL_COMMUNITY): Payer: 59

## 2021-10-29 DIAGNOSIS — Z950 Presence of cardiac pacemaker: Secondary | ICD-10-CM

## 2021-10-29 DIAGNOSIS — I110 Hypertensive heart disease with heart failure: Secondary | ICD-10-CM

## 2021-10-29 DIAGNOSIS — R001 Bradycardia, unspecified: Secondary | ICD-10-CM

## 2021-10-29 DIAGNOSIS — I5032 Chronic diastolic (congestive) heart failure: Secondary | ICD-10-CM

## 2021-10-29 HISTORY — PX: CARDIAC PACEMAKER PLACEMENT: SHX583

## 2021-10-29 LAB — BASIC METABOLIC PANEL
ANION GAP: 8 mmol/L — ABNORMAL LOW (ref 10–20)
BUN/CREA RATIO: 16 (ref 6–22)
BUN: 16 mg/dL (ref 7–25)
CALCIUM: 8.6 mg/dL (ref 8.6–10.3)
CHLORIDE: 109 mmol/L — ABNORMAL HIGH (ref 98–107)
CO2 TOTAL: 23 mmol/L (ref 21–31)
CREATININE: 1 mg/dL (ref 0.60–1.30)
ESTIMATED GFR: 75 mL/min/{1.73_m2} (ref >59)
GLUCOSE: 100 mg/dL (ref 74–109)
OSMOLALITY, CALCULATED: 281 mOsm/kg (ref 270–290)
POTASSIUM: 3.4 mmol/L — ABNORMAL LOW (ref 3.5–5.1)
SODIUM: 140 mmol/L (ref 136–145)

## 2021-10-29 LAB — MAGNESIUM: MAGNESIUM: 1.9 mg/dL (ref 1.9–2.7)

## 2021-10-29 SURGERY — INSERTION PERMANENT PACEMAKER
Anesthesia: Monitor Anesthesia Care | Site: Chest | Laterality: Left | Wound class: Clean Wound: Uninfected operative wounds in which no inflammation occurred

## 2021-10-29 MED ORDER — FENTANYL (PF) 50 MCG/ML INJECTION SOLUTION
INTRAMUSCULAR | Status: AC
Start: 2021-10-29 — End: 2021-10-29
  Filled 2021-10-29: qty 2

## 2021-10-29 MED ORDER — ALBUTEROL SULFATE 2.5 MG/3 ML (0.083 %) SOLUTION FOR NEBULIZATION
2.5000 mg | INHALATION_SOLUTION | Freq: Once | RESPIRATORY_TRACT | Status: DC | PRN
Start: 2021-10-29 — End: 2021-10-29

## 2021-10-29 MED ORDER — FAMOTIDINE (PF) 20 MG/2 ML INTRAVENOUS SOLUTION
20.0000 mg | Freq: Once | INTRAVENOUS | Status: AC
Start: 2021-10-29 — End: 2021-10-29
  Administered 2021-10-29: 20 mg via INTRAVENOUS

## 2021-10-29 MED ORDER — CEFAZOLIN 1 GRAM SOLUTION FOR INJECTION
INTRAMUSCULAR | Status: AC
Start: 2021-10-29 — End: 2021-10-29
  Filled 2021-10-29: qty 20

## 2021-10-29 MED ORDER — LACTATED RINGERS INTRAVENOUS SOLUTION
INTRAVENOUS | Status: DC | PRN
Start: 2021-10-29 — End: 2021-10-29

## 2021-10-29 MED ORDER — CEFAZOLIN 1 GRAM SOLUTION FOR INJECTION
Freq: Once | INTRAMUSCULAR | Status: DC | PRN
Start: 2021-10-29 — End: 2021-10-29
  Administered 2021-10-29: 2000 mg via INTRAVENOUS

## 2021-10-29 MED ORDER — FENTANYL (PF) 50 MCG/ML INJECTION WRAPPER
25.0000 ug | INJECTION | INTRAMUSCULAR | Status: DC | PRN
Start: 2021-10-29 — End: 2021-10-29

## 2021-10-29 MED ORDER — POTASSIUM CHLORIDE ER 20 MEQ TABLET,EXTENDED RELEASE(PART/CRYST)
40.0000 meq | ORAL_TABLET | ORAL | Status: AC
Start: 2021-10-29 — End: 2021-10-29
  Administered 2021-10-29: 40 meq via ORAL
  Filled 2021-10-29: qty 2

## 2021-10-29 MED ORDER — LACTATED RINGERS INTRAVENOUS SOLUTION
INTRAVENOUS | Status: DC
Start: 2021-10-29 — End: 2021-10-29

## 2021-10-29 MED ORDER — FAMOTIDINE (PF) 20 MG/2 ML INTRAVENOUS SOLUTION
INTRAVENOUS | Status: AC
Start: 2021-10-29 — End: 2021-10-29
  Filled 2021-10-29: qty 2

## 2021-10-29 MED ORDER — PROCHLORPERAZINE EDISYLATE 10 MG/2 ML (5 MG/ML) INJECTION SOLUTION
5.0000 mg | Freq: Once | INTRAMUSCULAR | Status: DC | PRN
Start: 2021-10-29 — End: 2021-10-29

## 2021-10-29 MED ORDER — FENTANYL (PF) 50 MCG/ML INJECTION WRAPPER
50.0000 ug | INJECTION | INTRAMUSCULAR | Status: DC | PRN
Start: 2021-10-29 — End: 2021-10-29

## 2021-10-29 MED ORDER — SODIUM CHLORIDE 0.9 % INTRAVENOUS PIGGYBACK
INJECTION | INTRAVENOUS | Status: AC
Start: 2021-10-29 — End: 2021-10-29
  Filled 2021-10-29: qty 100

## 2021-10-29 MED ORDER — HYDROCODONE 7.5 MG-ACETAMINOPHEN 325 MG TABLET
1.0000 | ORAL_TABLET | ORAL | Status: DC | PRN
Start: 2021-10-29 — End: 2021-10-30

## 2021-10-29 MED ORDER — IPRATROPIUM 0.5 MG-ALBUTEROL 3 MG (2.5 MG BASE)/3 ML NEBULIZATION SOLN
3.0000 mL | INHALATION_SOLUTION | Freq: Once | RESPIRATORY_TRACT | Status: DC | PRN
Start: 2021-10-29 — End: 2021-10-29

## 2021-10-29 MED ORDER — PROPOFOL 10 MG/ML IV BOLUS
INJECTION | Freq: Once | INTRAVENOUS | Status: DC | PRN
Start: 2021-10-29 — End: 2021-10-29
  Administered 2021-10-29 (×4): 20 mg via INTRAVENOUS
  Administered 2021-10-29 (×2): 30 mg via INTRAVENOUS
  Administered 2021-10-29 (×3): 20 mg via INTRAVENOUS

## 2021-10-29 MED ORDER — FENTANYL (PF) 50 MCG/ML INJECTION WRAPPER
INJECTION | Freq: Once | INTRAMUSCULAR | Status: DC | PRN
Start: 2021-10-29 — End: 2021-10-29
  Administered 2021-10-29 (×4): 50 ug via INTRAVENOUS

## 2021-10-29 MED ORDER — ONDANSETRON HCL (PF) 4 MG/2 ML INJECTION SOLUTION
4.0000 mg | Freq: Once | INTRAMUSCULAR | Status: AC
Start: 2021-10-29 — End: 2021-10-29
  Administered 2021-10-29: 4 mg via INTRAVENOUS

## 2021-10-29 MED ORDER — LIDOCAINE 1 %-EPINEPHRINE 1:100,000 INJECTION SOLUTION
Freq: Once | INTRAMUSCULAR | Status: DC | PRN
Start: 2021-10-29 — End: 2021-10-29
  Administered 2021-10-29: 20 mL via INTRAMUSCULAR

## 2021-10-29 MED ORDER — SODIUM CHLORIDE 0.9 % (FLUSH) INJECTION SYRINGE
3.0000 mL | INJECTION | INTRAMUSCULAR | Status: DC | PRN
Start: 2021-10-29 — End: 2021-10-29

## 2021-10-29 MED ORDER — ONDANSETRON HCL (PF) 4 MG/2 ML INJECTION SOLUTION
4.0000 mg | Freq: Once | INTRAMUSCULAR | Status: DC | PRN
Start: 2021-10-29 — End: 2021-10-29

## 2021-10-29 MED ORDER — LIDOCAINE (PF) 100 MG/5 ML (2 %) INTRAVENOUS SYRINGE
INJECTION | Freq: Once | INTRAVENOUS | Status: DC | PRN
Start: 2021-10-29 — End: 2021-10-29
  Administered 2021-10-29: 100 mg via INTRAVENOUS

## 2021-10-29 MED ORDER — LACTATED RINGERS INTRAVENOUS SOLUTION
INTRAVENOUS | Status: DC
Start: 2021-10-29 — End: 2021-10-30
  Administered 2021-10-30: 0 mL via INTRAVENOUS

## 2021-10-29 MED ORDER — HEPARIN (PORCINE) 5,000 UNIT/ML INJECTION SOLUTION
INTRAMUSCULAR | Status: AC
Start: 2021-10-29 — End: 2021-10-29
  Filled 2021-10-29: qty 1

## 2021-10-29 MED ORDER — LIDOCAINE 1 %-EPINEPHRINE 1:100,000 INJECTION SOLUTION
INTRAMUSCULAR | Status: AC
Start: 2021-10-29 — End: 2021-10-29
  Filled 2021-10-29: qty 50

## 2021-10-29 MED ORDER — SODIUM CHLORIDE 0.9 % (FLUSH) INJECTION SYRINGE
3.0000 mL | INJECTION | Freq: Three times a day (TID) | INTRAMUSCULAR | Status: DC
Start: 2021-10-29 — End: 2021-10-29
  Administered 2021-10-29: 0 mL

## 2021-10-29 MED ORDER — SODIUM CHLORIDE 0.9 % INTRAVENOUS SOLUTION
2.0000 g | Freq: Once | INTRAVENOUS | Status: DC
Start: 2021-10-29 — End: 2021-10-30
  Filled 2021-10-29: qty 20

## 2021-10-29 MED ORDER — ONDANSETRON HCL (PF) 4 MG/2 ML INJECTION SOLUTION
INTRAMUSCULAR | Status: AC
Start: 2021-10-29 — End: 2021-10-29
  Filled 2021-10-29: qty 2

## 2021-10-29 SURGICAL SUPPLY — 83 items
ADH LIQUID LF  WTPRF VIAL PREP NONSTAIN MASTISOL STYRAX GUM MASTIC ALC MTHY SLCYT STRL CLR NHZR 2/3 (WOUND CARE SUPPLY) ×1 IMPLANT
ADH LQ LF VIAL AMP PREP MASTI_SOL STYRAX GUM MASTIC ALC MTHY (WOUND CARE/ENTEROSTOMAL SUPPLY) ×2
BAG BIOHAZ RD 30X24IN THK3 MIL C8-10GL LLDPE INFCT WASTE CAN (MED SURG SUPPLIES) ×2
BAG BIOHAZ RD 30X24IN THK3 MIL C8-10GL LLDPE INFCT WASTE CAN PNCT RST (MED SURG SUPPLIES) ×1
BAG SUT DVN STRL LF (SUTURE/WOUND CLOSURE) ×1 IMPLANT
BAG SUTURE DEVON STERILE LATEX FREE (SUTURE/WOUND CLOSURE) ×2
BLADE 10 2 END CBNSTL SURG STRL DISP (CUTTING ELEMENTS) ×1
BLADE 10 2 END CBNSTL SURG STRL DISP (SURGICAL CUTTING SUPPLIES) ×1 IMPLANT
BLADE 15 2 END CBNSTL SURG STRL DISP (CUTTING ELEMENTS) ×1
BLADE 15 2 END CBNSTL SURG STRL DISP (SURGICAL CUTTING SUPPLIES) ×1 IMPLANT
CLEANER ESURG TIP 2X2IN TIP POLISHR CAUT STRL LF (CUTTING ELEMENTS) ×1
CLEANER ESURG TIP 2X2IN TIP POLISHR CAUT STRL LF (SURGICAL CUTTING SUPPLIES) ×1 IMPLANT
CLEANER INSTR PREPZYME MUL-TRD CONTAINR NARSL NEUT PH BDGR (MISCELLANEOUS PT CARE ITEMS) ×2
CONV USE 123874 - SYRINGE AMSURE MDCHC 60CC LF  STRL TIP PRTC SM TUBE ADPR IRRG DISC BULB POLYPROP (MED SURG SUPPLIES) ×1 IMPLANT
CONV USE 31829 - NEEDLE HYPO  18GA 1.5IN STD REG BVL LF (MED SURG SUPPLIES) ×1 IMPLANT
CONV USE ITEM 323185 - PAD EG 15SQ IN UNIV FOAM SPLT NONCORD ADULT 9100 SER (SURGICAL CUTTING SUPPLIES) ×1 IMPLANT
CONV USE ITEM 329146 - CLEANER INSTR PREPZYME MUL-TRD CONTAINR NARSL NEUT PH BDGR 22OZ (MISCELLANEOUS PT CARE ITEMS) ×1 IMPLANT
CONV USE ITEM 34153 - ELECTRODE ESURG BLADE PNCL 3/32IN STRL SS CAUT PSHBTN STD SHAFT LF  VEGA SER (SURGICAL CUTTING SUPPLIES) ×1 IMPLANT
CONV USE ITEM 45435 - STRIP SKNCLS MDSTRP FBR NYL POR MED 4X.5IN ADH HYPOALL REINF (SUTURE/WOUND CLOSURE) ×1 IMPLANT
CONV USE ITEM 46133 - SUTURE 4-0 C-13 MONOSOF 18IN BLK MONOF NONAB (SUTURE/WOUND CLOSURE) ×1 IMPLANT
CONV USE ITEM 46367 - SUTURE SILK 3-0 V-20 SOFSILK D-TACH 18IN BLK BRD 5 STRN COAT NONAB (SUTURE/WOUND CLOSURE) ×1 IMPLANT
CONV USE ITEM 46657 - SUTURE 2-0 V-20 POLYSRB 30IN VIOL BRD COAT ABS (SUTURE/WOUND CLOSURE) ×2 IMPLANT
CONV USE ITEM 81225 - BAG BIOHAZ RD 30X24IN THK3 MIL C8-10GL LLDPE INFCT WASTE CAN PNCT RST (MED SURG SUPPLIES) ×1 IMPLANT
COUNTER 20 CNT BLOCK ADH NEEDLE STRL LF  RD SHARP FOAM 15.75X11.5X14IN DISP (MED SURG SUPPLIES) ×1 IMPLANT
COUNTER 20 CNT BLOCK ADH NEEDLE STRL LF RD SHARP FOAM 15.75 (MED SURG SUPPLIES) ×2
COVER TBL 90X50IN STD SMS REINF FNFLD STRL LF  DISP (DRAPE/PACKS/SHEETS/OR TOWEL) ×1 IMPLANT
COVER TBL 90X50IN STD SMS REINF FNFLD STRL LF DISP (DRAPE/PACKS/SHEETS/OR TOWEL) ×2
DRAPE CARM MBL XRY BAND 64X42IN LRG EQP RUB (DRAPE/PACKS/SHEETS/OR TOWEL) ×1 IMPLANT
DRAPE CARM MBL XRY BAND 64X42I_N LRG EQP RUB (DRAPE/PACKS/SHEETS/OR TOWEL) ×2
ELECTRODE DEFIBR 1STP CODE-RDY RESUS COMPLETE SPD PK CONN R SER (MED SURG SUPPLIES) ×1 IMPLANT
ELECTRODE DEFIBR 1STP CODE-RDY_PRECNCT TIN CNDCT SLD GEL CPR (MED SURG SUPPLIES) ×2
ELECTRODE ESURG BLADE PNCL 3/32IN STRL SS CAUT PSHBTN STD (CUTTING ELEMENTS) ×2
GLOVE SURG 7 LF  PF STRL PLISPRN DISP (GLOVES AND ACCESSORIES) ×2 IMPLANT
GLOVE SURG 7 LF PF SMOOTH STRL WHT PLISPRN (GLOVES AND ACCESSORIES) ×4
GLOVE SURG 7.5 LF  PF SMOOTH BEAD CUF STRL GRN 12IN SENSICARE PI GRN PLISPRN PLMR ALOE THK7.9 MIL (GLOVES AND ACCESSORIES) ×1 IMPLANT
GLOVE SURG 7.5 LF PF SMOOTH BEAD CUF STRL GRN 12IN (GLOVES AND ACCESSORIES) ×2
GOWN SURG LRG STD LGTH REG L3 NONREINFORCE BRTHBL TWL STRL (DRAPE/PACKS/SHEETS/OR TOWEL) ×2
GOWN SURG LRG STD LGTH REG L3 NONREINFORCE BRTHBL TWL STRL LF  DISP BLU HALYARD SPECTRUM SMS (DRAPE/PACKS/SHEETS/OR TOWEL) ×1 IMPLANT
GOWN SURG XL STD LGTH L3 NONREINFORCE HKLP CLSR TWL STRL LF (DRAPE/PACKS/SHEETS/OR TOWEL) ×2
GOWN SURG XL STD LGTH L3 NONREINFORCE HKLP CLSR TWL STRL LF  DISP BLU SPECTRUM SMS (DRAPE/PACKS/SHEETS/OR TOWEL) ×1
GOWN SURG XL STD LGTH L3 NONREINFORCE HKLP CLSR TWL STRL LF DISP BLU SPECTRUM SMS (DRAPE/PACKS/SHEETS/OR TOWEL) ×1 IMPLANT
HDPE THK22 UM C40-45 GL L48 IN X W40 IN NATURAL (MISCELLANEOUS PT CARE ITEMS) ×2 IMPLANT
IMB ORTHO UNIV 24-50IN SHLDR CONTOURED STRAP BCKL ADJ FOAM (ORTHOPEDICS (NOT IMPLANTS)) ×2
IMB ORTHO UNIV 24-50IN SHLDR CONTOURED STRAP BCKL ADJ FOAM LF (ORTHOPEDICS (NOT IMPLANTS)) ×1 IMPLANT
INTROD SFSHTH II 9FR 18GA 19.5_CM 13CM TRAW SHTH SDPRT NEEDLE (VASCULAR) ×2
KIT INTROD 13CM 7FR SAFESHEATH II PLWY (INTRODUCER) ×1 IMPLANT
KIT INTROD 13CM 9FR SAFESHEATH II PLWY (VASCULAR) ×1 IMPLANT
LABEL MED CORRECT MED LABELING SYS 4 FLG 2 SHEET 24 PRPRNT (MED SURG SUPPLIES) ×2
LABEL MED CORRECT MED LABELING SYS 4 FLG 2 SHEET 24 PRPRNT STRL (MED SURG SUPPLIES) ×1 IMPLANT
LEAD CAPSURE FIX 5076 507645_NOVUS 5076-45 (LEAD) IMPLANT
LEAD CAPSURE FIX 5076 507652_NOVUS 5076-52 (LEAD) IMPLANT
LEAD CAPSURE FIX 5076 507658_NOVUS 5076-58 (LEAD) IMPLANT
LEAD CAPSURE SENSE 407452_407452 (LEAD) IMPLANT
LEAD CAPSURE SENSE 457445_457445 (LEAD) IMPLANT
LEAD PACING 52CM 6.2FR 2MM 10MM SPC SM STR ATR VNTRC BIPOLAR SCR IN IMPL ACT FIX CP NV STRD ELUT (LEAD) ×1 IMPLANT
LEAD PACING 58CM 6.2FR 2MM 10MM SPC SM STR ATR VNTRC BIPOLAR SCR IN IMPL ACT FIX CP NV STRD ELUT (LEAD) ×1 IMPLANT
NEEDLE HYPO  25GA 1.5IN REG WL PRCSNGL SS POLYPROP REG BVL LL HUB DEHP-FR BLU STRL LF  DISP (MED SURG SUPPLIES) ×2 IMPLANT
NEEDLE HYPO 18GA 1.5IN STD RE_G BVL LF (MED SURG SUPPLIES) ×2
NEEDLE HYPO 25GA 1.5IN REG WL_REG BVL BD POLYPROP LL HUB (MED SURG SUPPLIES) ×4
PACEMAKER CARDIAC 7.4MM 50.8X46.6MM AZURE XT DR MRI SURESCAN 2 CHAMBER 22.5GM TI POLYUR SIL RUB ×1 IMPLANT
PACEMAKER DUAL CHMBR AZURE XT_DR MRI IMPLANT
PACEMAKER SNGL CHMBR AZURE XT_SR MRI IMPLANT
PACK SURG UBR BCK TBL CVR ZN REINF MAYO STAND CVR STRL DISP (CUSTOM TRAYS & PACK) ×2
PACK SURG UBR BCK TBL CVR ZN REINF MAYO STAND CVR STRL DISP 90X44IN 54X23IN LF (CUSTOM TRAYS & PACK) ×1 IMPLANT
PAD EG 15SQ IN UNIV FOAM SPLT NONCORD ADULT 9100 SER (CUTTING ELEMENTS) ×2
SHEATH 7FR 18GA 19.5CM 13CM SA_FESHEATH II TEARAWAY SDPRT (INTRODUCER) ×2
SOL IRRG 0.9% NACL 1000ML PLASTIC PR BTL ISTNC N-PYRG STRL LF (MEDICATIONS/SOLUTIONS) ×1 IMPLANT
SOLUTION IRRG NS 2F7124 1000CC_12/CS (MEDICATIONS/SOLUTIONS) ×2
SPONGE GAUZE 4X4IN AV GZ CLU COTTON ABS NWVN POSTOP LF  STRL DISP (WOUND CARE SUPPLY) ×1 IMPLANT
SPONGE GAUZE 4X4IN AV GZ CLU COTTON ABS NWVN POSTOP LF STRL (WOUND CARE/ENTEROSTOMAL SUPPLY) ×2
SPONGE SURG 4X4IN 16 PLY XRY DETECT COTTON STRL LF  DISP (WOUND CARE SUPPLY) ×2 IMPLANT
SPONGE SURG 4X4IN 16 PLY_RADOPQ COT STRL LF DISP (WOUND CARE/ENTEROSTOMAL SUPPLY) ×4
STRIP SKNCLS MDSTRP FBR NYL POR MED 4X.5IN ADH HYPOALL REINF (SUTURE/WOUND CLOSURE) ×1
SUTURE 2-0 V-20 POLYSRB 30IN VIOL BRD COAT ABS (SUTURE/WOUND CLOSURE) ×4
SUTURE 4-0 C-13 MONOSOF 18IN BLK MONOF NONAB (SUTURE/WOUND CLOSURE) ×2
SUTURE SILK 3-0 V-20 SOFSILK D-TACH 18IN BLK BRD 5 STRN COAT (SUTURE/WOUND CLOSURE) ×2
SYRINGE AMSURE MDCHC 60CC LF  STRL TIP PRTC SM TUBE ADPR IRRG DISC BULB POLYPROP (MED SURG SUPPLIES) ×1
SYRINGE AMSURE MDCHC 60CC LF STRL TIP PRTC SM TUBE ADPR (MED SURG SUPPLIES) ×2
SYRINGE LL 10ML LF  STRL GRAD N-PYRG DEHP-FR PVC FREE MED DISP (MED SURG SUPPLIES) ×2 IMPLANT
SYRINGE LL 10ML LF STRL MED D_ISP (MED SURG SUPPLIES) ×4
TAPE DURAPORE 2IN 6/BX 1538-2 (WOUND CARE SUPPLY) ×1 IMPLANT
TAPE DURAPORE 2IN 6/BX 1538-2 (WOUND CARE/ENTEROSTOMAL SUPPLY) ×1
TOWEL 24X16IN COTTON BLU DISP SURG STRL LF (DRAPE/PACKS/SHEETS/OR TOWEL) ×4 IMPLANT

## 2021-10-29 NOTE — Consults (Signed)
[facility]  General Surgery  Consultation    Date of Service:  10/29/2021  [name], [name], 81 y.o. male  Date of Admission:  10/27/2021  Date of Birth:  31-Jul-1940  PCP: Raye Sorrow, MD    Reason for Consultation:  Dual-chamber pacemaker    HPI:  Tanner [name] is a 81 y.o. 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  He has been experiencing bradycardia and slow atrial fibrillation.  General surgery consulted for dual-chamber pacemaker insertion.    Past Medical History:   Diagnosis Date    Disorder of thyroid     Esophageal reflux     Heart disease     Hx of thyroid cancer     Hyperlipidemia     Hypertension     Lung cancer (CMS HCC)       Past Surgical History:   Procedure Laterality Date    HX APPENDECTOMY      HX HEART SURGERY      open heart x2    HX MITRAL VALVE REPAIR      HX THYROIDECTOMY        Social History     Tobacco Use    Smoking status: Former     Packs/day: 1.00     Years: 48.00     Pack years: 48.00     Types: Cigarettes     Start date: 1955     Quit date: 2003     Years since quitting: 20.6    Smokeless tobacco: Never   Substance Use Topics    Alcohol use: Never    Drug use: Never       Family Medical History:       Problem Relation (Age of Onset)    Cancer Mother    Heart Attack Father           Medications Prior to Admission       Prescriptions    albuterol sulfate (PROVENTIL OR VENTOLIN OR PROAIR) 90 mcg/actuation Inhalation oral inhaler    Take 2 Puffs by inhalation Once a day    aspirin 81 mg Oral Tablet, Chewable    Chew 1 Tablet (81 mg total) Once a day    atorvastatin (LIPITOR) 80 mg Oral Tablet    Take 1 Tablet (80 mg total) by mouth Every night    bromocriptine (PARLODEL) 2.5 mg Oral Tablet    Take 1 Tablet (2.5 mg total) by mouth Once a day    calcium carbonate-vitamin D3 600 mg-10 mcg (400 unit) Oral Tablet    Take 2 Tablets by mouth Once a day    diphenhydrAMINE (BENADRYL) 50 mg Oral Capsule    Take 1 Capsule (50 mg  total) by mouth Every night    fluticasone propion-salmeteroL (ADVAIR) 500-50 mcg/dose Inhalation oral diskus inhaler    Take 1 Puff (1 INHALATION  total) by inhalation Twice daily    ibuprofen (MOTRIN) 400 mg Oral Tablet    Take 1 Tablet (400 mg total) by mouth Twice per day as needed    ketotifen fumarate (EYE ITCH RELIEF) 0.025 % (0.035 %) Ophthalmic Drops    Instill 1 Drop into both eyes Twice daily    levothyroxine (SYNTHROID) 100 mcg Oral Tablet    Take 1 Tablet (100 mcg total) by mouth Once a day    losartan (COZAAR) 50 mg Oral Tablet    Take 0.5 Tablets (25 mg total) by mouth Once a day  omeprazole (PRILOSEC) 20 mg Oral Capsule, Delayed Release(E.C.)    Take 1 Capsule (20 mg total) by mouth Once a day    tamsulosin (FLOMAX) 0.4 mg Oral Capsule    Take 1 Capsule (0.4 mg total) by mouth Every evening after dinner    torsemide (DEMADEX) 20 mg Oral Tablet    Take 0.5 Tablets (10 mg total) by mouth Once a day           Allergies   Allergen Reactions    Sulfa (Sulfonamides) Rash and Nausea/ Vomiting          Patient Vitals for the past 24 hrs:   BP Temp Pulse Resp SpO2   10/29/21 1034 (!) 158/80 36.4 C (97.5 F) 61 -- 94 %   10/29/21 1029 -- -- 51 -- --   10/29/21 0712 (!) 176/91 36.3 C (97.3 F) 57 -- 97 %   10/29/21 0337 (!) 143/82 36.5 C (97.7 F) 60 16 95 %   10/28/21 2246 -- -- 59 -- --   10/28/21 2106 -- -- 51 -- --   10/28/21 2000 -- -- -- -- 95 %   10/28/21 1904 (!) 144/84 36.6 C (97.8 F) 68 16 94 %   10/28/21 1350 (!) 160/82 36.6 C (97.9 F) 59 16 96 %   10/28/21 1158 (!) 148/76 36.2 C (97.1 F) 56 16 97 %          Physical Exam  Constitutional:       Appearance: Normal appearance.   Cardiovascular:      Rate and Rhythm: Normal rate and regular rhythm.      Heart sounds: Normal heart sounds.   Pulmonary:      Effort: Pulmonary effort is normal.      Breath sounds: Normal breath sounds.   Abdominal:      General: Bowel sounds are normal. There is no distension.      Palpations: Abdomen is soft.    Psychiatric:         Behavior: Behavior is uncooperative.        Laboratory Data:     Results for orders placed or performed during the hospital encounter of 10/27/21 (from the past 24 hour(s))   BASIC METABOLIC PANEL   Result Value Ref Range    SODIUM 140 136 - 145 mmol/L    POTASSIUM 3.4 (L) 3.5 - 5.1 mmol/L    CHLORIDE 109 (H) 98 - 107 mmol/L    CO2 TOTAL 23 21 - 31 mmol/L    ANION GAP 8 (L) 10 - 20 mmol/L    CALCIUM 8.6 8.6 - 10.3 mg/dL    GLUCOSE 100 74 - 109 mg/dL    BUN 16 7 - 25 mg/dL    CREATININE 1.00 0.60 - 1.30 mg/dL    BUN/CREA RATIO 16 6 - 22    ESTIMATED GFR 75 >59 mL/min/1.51m^2    OSMOLALITY, CALCULATED 281 270 - 290 mOsm/kg   MAGNESIUM   Result Value Ref Range    MAGNESIUM 1.9 1.9 - 2.7 mg/dL       Imaging Studies:    XR AP MOBILE CHEST   Final Result by Edi, Radresults In (08/01 1632)      1. Hazy right basilar and left perihilar opacities new from October 03, 2018.   2. Stable postsurgical cardiomegaly with background findings suggestive of underlying COPD.         Radiologist location ID: [id number]  Assessment/Plan:  Active Hospital Problems    Diagnosis    Primary Problem: Bradycardia    Congestive heart failure of unknown etiology (CMS HCC)    Hypertension    Coronary artery disease       NPO.  Plan for dual-chamber pacemaker insertion.Risks, benefits, indications, complication of pacemaker placement were discussed including but not limited to bleeding, infection, pneumothorax, arterial injury, worsening cardiac arrhythmia, device related infectious process, procedural discomfort, air embolus, DVT, pacemaker dependency, and even procedural related death including fatal arrhythmia.  Also understands risk of lead dislodgment as well as need for further surgery.  Discussed the fact that the indication for the pacemaker was made by consulting/cardiology physician.  Risks of anesthesia were also reviewed and anticipated postoperative course discussed.  All questions answered with  informed signed consent obtained.    This note was partially created using voice recognition software and is inherently subject to errors including those of syntax and "sound alike " substitutions which may escape proof reading. In such instances, original meaning may be extrapolated by contextual derivation.    The advanced practice clinician's documentation was reviewed/amended in its entirety with the assessment and plan portion completely performed independently by me during this separate encounter.  Bridgett Larsson MD MBA CPE [place], FNP

## 2021-10-29 NOTE — Anesthesia Postprocedure Evaluation (Signed)
Anesthesia Post Op Evaluation    Patient: Tanner [name]  Procedure(s):  DUAL CHAMBER PACEMAKER INSERTION WITH LEADS    Last Vitals:Temperature: 36.1 C (97 F) (10/29/21 1522)  Heart Rate: 82 (10/29/21 1522)  BP (Non-Invasive): (!) 150/89 (10/29/21 1522)  Respiratory Rate: 20 (10/29/21 1522)  SpO2: 98 % (10/29/21 1522)    No notable events documented.    Patient is sufficiently recovered from the effects of anesthesia to participate in the evaluation and has returned to their pre-procedure level.  Patient location during evaluation: PACU       Patient participation: complete - patient participated  Level of consciousness: awake and alert and responsive to verbal stimuli    Pain score: 0  Pain management: adequate  Airway patency: patent    Anesthetic complications: no  Cardiovascular status: acceptable  Respiratory status: acceptable  Hydration status: acceptable  Patient post-procedure temperature: Pt Normothermic   PONV Status: Absent

## 2021-10-29 NOTE — Consults (Signed)
[facility] Follow up          Ko, Bardon  Date of Admission:  10/27/2021  Date of Birth:  05/30/1940  Date of Service:  10/29/2021    Hospital Day:  LOS: 1 day     Chief Complaint:  High-grade AV block with 2-1 AV dissociation and sick sinus syndrome manifested by bradycardia and slow atrial fibrillation  Subjective:   Tanner [name] is a 81 y.o. male with high-grade AV block.  Denies any chest pain or dyspnea.  He is lying in bed and is comfortable    Vital Signs:  Temp (24hrs) Max:36.6 C (97.9 F)      Temperature: 36.4 C (97.5 F)  BP (Non-Invasive): (!) 158/80  MAP (Non-Invasive): 100 mmHG  Heart Rate: 61  Respiratory Rate: 16  SpO2: 94 %    Current Medications:  acetaminophen (TYLENOL) tablet, 650 mg, Oral, Q4H PRN  aluminum-magnesium hydroxide-simethicone (MAG-AL PLUS) 200-200-20 mg per 5 mL oral liquid, 10 mL, Oral, Q4H PRN  aspirin chewable tablet 81 mg, 81 mg, Oral, Daily  atorvastatin (LIPITOR) tablet, 80 mg, Oral, NIGHTLY  bromocriptine (PARLODEL) tablet, 2.5 mg, Oral, Daily  diphenhydrAMINE (BENADRYL) capsule, 50 mg, Oral, NIGHTLY  docusate sodium (COLACE) capsule, 100 mg, Oral, 2x/day PRN  enoxaparin PF (LOVENOX) 40 mg/0.4 mL SubQ injection, 40 mg, Subcutaneous, Q24H  ipratropium-albuterol 0.5 mg-3 mg(2.5 mg base)/3 mL Solution for Nebulization, 3 mL, Nebulization, Q4H PRN  levothyroxine (SYNTHROID) tablet, 100 mcg, Oral, Daily  losartan (COZAAR) tablet, 25 mg, Oral, Daily  perflutren lipid microspheres (DEFINITY) 1.3 mL in NS 10 mL (tot vol) injection, 2 mL, Intravenous, Cardiology Once PRN  tamsulosin (FLOMAX) capsule, 0.4 mg, Oral, Daily after Dinner  torsemide (DEMADEX) tablet, 10 mg, Oral, Daily            Today's Physical Exam:  Physical Exam .  Alert and comfortable lying in bed    Heart sounds are regular lungs are clear        I/O:  I/O last 24 hours:    Intake/Output Summary (Last 24 hours) at 10/29/2021 1051  Last data filed at 10/29/2021 0800  Gross per 24 hour    Intake 720 ml   Output --   Net 720 ml     I/O current shift:  08/03 0700 - 08/03 1859  In: 240 [P.O.:240]  Out: -       Labs  Please indicate ordered or reviewed)  Reviewed:     BMP:    Basic Metabolic Profile    Lab Results   Component Value Date/Time    SODIUM 140 10/29/2021 03:54 AM    POTASSIUM 3.4 (L) 10/29/2021 03:54 AM    CHLORIDE 109 (H) 10/29/2021 03:54 AM    CO2 23 10/29/2021 03:54 AM    ANIONGAP 8 (L) 10/29/2021 03:54 AM    Lab Results   Component Value Date/Time    BUN 16 10/29/2021 03:54 AM    CREATININE 1.00 10/29/2021 03:54 AM    GLUCOSENF 100 10/29/2021 03:54 AM          CMP: @LASTCMP @   CBC: COMPLETE BLOOD COUNT   Lab Results   Component Value Date    WBC 6.4 10/28/2021    HGB 12.4 (L) 10/28/2021    HCT 35.9 (L) 10/28/2021    PLTCNT 151 10/28/2021       DIFFERENTIAL  Lab Results   Component Value Date    PMNS 65 10/28/2021  LYMPHOCYTES 24 (L) 10/28/2021    MONOCYTES 8 10/28/2021    EOSINOPHIL 3 10/28/2021    BASOPHILS 0 10/28/2021    BASOPHILS 0.00 10/28/2021    PMNABS 4.20 10/28/2021    LYMPHSABS 1.50 10/28/2021    EOSABS 0.20 10/28/2021    MONOSABS 0.50 10/28/2021        Hepatic Function:   Hepatic Function    Lab Results   Component Value Date/Time    ALBUMIN 3.6 10/27/2021 04:20 PM    TOTALPROTEIN 6.1 (L) 10/27/2021 04:20 PM    ALKPHOS 91 10/27/2021 04:20 PM    Lab Results   Component Value Date/Time    AST 14 10/27/2021 04:20 PM    ALT 10 10/27/2021 04:20 PM             Magnesium: MAGNESIUM  Lab Results   Component Value Date    MAGNESIUM 1.9 10/29/2021        PT: COAGULATION TESTS  Lab Results   Component Value Date    HCT 35.9 (L) 10/28/2021    PLTCNT 151 10/28/2021          INR:   No results found for this encounter  PTT:   No results found for this encounter  TROPONIN I  Lab Results   Component Value Date    TROPONINI 13 10/27/2021    TROPONINI 13 10/27/2021    TROPONINI 11 10/27/2021          Lab Results   Component Value Date    BNP 366 (H) 10/27/2021        Last 3 Cardiac Markers:  No results found for this encounter  Blood Gas: No results found for this encounter  Lipid Panel:  No results found for this encounter  TSH:  No results found for this encounter      Diagnostic Tests (Please indicate ordered or reviewed)  Reviewed:     No results found for this or any previous visit.   @LASTECHORESULTS @   @LASTECHO @     Radiology Tests   Reviewed:     XR AP MOBILE CHEST   Final Result      1. Hazy right basilar and left perihilar opacities new from October 03, 2018.   2. Stable postsurgical cardiomegaly with background findings suggestive of underlying COPD.         Radiologist location ID: [id number]                 Problem List:  Active Hospital Problems   (*Primary Problem)    Diagnosis    *Bradycardia    Congestive heart failure of unknown etiology (CMS HCC)    Hypertension    Coronary artery disease     Assessment.      1. 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 Coronary artery disease status post coronary artery bypass surgery 2017.       3. Hypertension hyperlipidemia     Plan:   Patient would have a dual-chamber pacemaker insertion as he is maintaining normal sinus rhythm at this time    Surgical consultation has already been requested and was discussed with the patient.      Thank you      Hewitt Shorts, MD

## 2021-10-29 NOTE — Care Plan (Signed)
Problem: Adult Inpatient Plan of Care  Goal: Absence of Hospital-Acquired Illness or Injury  Intervention: Identify and Manage Fall Risk  Recent Flowsheet Documentation  Taken 10/28/2021 2147 by Jesse Sans, RN  Safety Promotion/Fall Prevention:   activity supervised   safety round/check completed     Problem: Adult Inpatient Plan of Care  Goal: Optimal Comfort and Wellbeing  Outcome: Ongoing (see interventions/notes)     Problem: Fall Injury Risk  Goal: Absence of Fall and Fall-Related Injury  Intervention: Promote Injury-Free Environment  Recent Flowsheet Documentation  Taken 10/28/2021 2147 by Jesse Sans, RN  Safety Promotion/Fall Prevention:   activity supervised   safety round/check completed     Problem: Cardiac Output Decreased  Goal: Effective Cardiac Output  Outcome: Ongoing (see interventions/notes)

## 2021-10-29 NOTE — OR Surgeon (Signed)
[facility]      Patient Name: Erdem, Naas Number: [id number]  Date of Service: 10/29/2021   Date of Birth: April 01, 1940      Pre-Operative Diagnosis: bradycardia     Post-Operative Diagnosis: bradycardia    Procedure(s)/Description:  DUAL CHAMBER PACEMAKER INSERTION WITH LEADS: 29574 (CPT)     Attending Surgeon: Shirlee More, MD     Anesthesia:  Anesthesiologist: Glori Bickers, MD  CRNA: Freddie Breech, CRNA    Anesthesia Type: .Monitor Anesthesia Care     Estimated Blood Loss:  less than 50 ml    Specimens Removed: NONE    The patient was brought to the operating suite, placed supine upon the operating room table where anesthesia services provided IV sedation after placement of all appropriate lines and leads including external pacing pads in anticipation of pacemaker placement.  The patient was then given appropriate amount of monitored anesthesia care and sedation. The patient's chest and neck were then prepped and draped in usual sterile fashion in anticipation of dual-chamber pacemaker placement.  The patient was placed in Trendelenburg position where the skin and subcutaneous tissue at the level of clavicle was injected with local analgesia in anticipation of pacemaker placement.  An 18-gauge needle was then used to access the subclavian vein with passage of a guidewire under fluoroscopy into the superior vena cava.  Once this maneuver was accomplished, a subcutaneous pocket was created at the level of the access wire.  The 9 French sheath was then placed over the guidewire into the superior vena cava where the ventricular lead was passed down the sheath without difficulty.  The pull-away sheath was then removed with the guidewire in place.  The ventricular lead was then manipulated through the valve into the apex of the right ventricle where pacemaker analysis was accomplished with adequate results.  A smaller sheath was then placed back over the guidewire for placement of the  atrial lead.  The atrial lead was passed down the sheath after removal of the guidewire and dilator where manipulation of the lead allowed placement into the atrial appendage.  This was also analyzed with adequate numbers identified.  The Medtronic dual-chamber pacemaker was then brought to the field and attached to the leads per routine with persistence of acceptable characteristics and analysis.  The pacemaker leads were then attached to the periclavicular fascia.  The leads were then attached to the dual-chamber pacemaker generator without difficulty and found to be functioning appropriately.  The pacemaker was then placed into the subcutaneous pocket previously developed.  The generator was stabilized to the chest wall fascia using a 2-0 Vicryl followed by closure of the wound to the use of running Vicryl and the subcutaneous tissue and closure of the skin using running subcuticular monofilament absorbable suture.  Steri-Strips were then placed perpendicular to the axis of incision.  The patient tolerated the procedure well and was returned to the postanesthesia care unit in stable condition after extubating in the operating suite    Pacemaker ventricular lead serial number [id number]  Pacemaker atrial lead serial number [id number]  Pacemaker generator serial number [id number]  Bridgett Larsson MD MBA CPE FACS

## 2021-10-29 NOTE — Anesthesia Preprocedure Evaluation (Addendum)
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  Facial hair  Beard        Dental           (+) upper dentures, lower dentures           Pulmonary    Breath sounds clear to auscultation  (-) no rhonchi, no decreased breath sounds, no wheezes, no rales and no stridor     Cardiovascular    Rhythm: irregular  Rate: Normal       Other findings            Plan  ASA 4 - emergent     Planned anesthesia type: MAC         plan to administer opioids postoperatively            PONV/POV Plan:  I plan to administer pharmcologic prophalaxis antiemetics      Anesthesia issues/risks discussed are: Eye /Visual Loss, Stroke, Aspiration, Difficult Airway, Nerve Injuries, PONV, Cardiac Events/MI, Intraoperative Awareness/ Recall, Blood Loss and Sore Throat.  Anesthetic plan and risks discussed with patient  signed consent obtained                       Plan discussed with CRNA.    (Last meal at 7 am)

## 2021-10-29 NOTE — Progress Notes (Signed)
[facility]  Progress Note    Tanner Tanner [name]  Date of service: 10/29/2021  Date of Admission:  10/27/2021  Hospital Day:  LOS: 1 day     Subjective: Tanner Tanner [name] is an 81 year old man who came in with bradycardia in the 30s.      10/28/2021: He was seen by Cardiology who determined he would need pacemaker placement.     Today: pacemaker placed by cardiology. He has no complaints. States that he can tell the difference in how he feels and notes feeling better since pacemaker placement.    Objective:      Vital Signs:  Vitals:    10/29/21 1705 10/29/21 1735 10/29/21 1805 10/29/21 1903   BP: (!) 151/98 (!) 145/95 135/82 (!) 144/89   Pulse: 68 70 85 85   Resp: _0 Temp:    36.4 C (97.5 F)   SpO2: 92% 91% 92% 94%   Weight:       Height:       BMI:                I/O:  I/O last 24 hours:    Intake/Output Summary (Last 24 hours) at 10/29/2021 1939  Last data filed at 10/29/2021 1600  Tanner [name] per 24 hour   Intake 690 ml   Output 350 ml   Net 340 ml           acetaminophen (TYLENOL) tablet, 650 mg, Oral, Q4H PRN  aluminum-magnesium hydroxide-simethicone (MAG-AL PLUS) 200-200-20 mg per 5 mL oral liquid, 10 mL, Oral, Q4H PRN  aspirin chewable tablet 81 mg, 81 mg, Oral, Daily  atorvastatin (LIPITOR) tablet, 80 mg, Oral, NIGHTLY  bromocriptine (PARLODEL) tablet, 2.5 mg, Oral, Daily  diphenhydrAMINE (BENADRYL) capsule, 50 mg, Oral, NIGHTLY  docusate sodium (COLACE) capsule, 100 mg, Oral, 2x/day PRN  enoxaparin PF (LOVENOX) 40 mg/0.4 mL SubQ injection, 40 mg, Subcutaneous, Q24H  HYDROcodone-acetaminophen (NORCO) 7.5 mg-325 mg per tablet, 1 Tablet, Oral, Q4H PRN  ipratropium-albuterol 0.5 mg-3 mg(2.5 mg base)/3 mL Solution for Nebulization, 3 mL, Nebulization, Q4H PRN  levothyroxine (SYNTHROID) tablet, 100 mcg, Oral, Daily  losartan (COZAAR) tablet, 25 mg, Oral, Daily  LR premix infusion, , Intravenous, Continuous  perflutren lipid microspheres (DEFINITY) 1.3 mL in NS 10 mL (tot vol) injection, 2 mL, Intravenous,  Cardiology Once PRN  tamsulosin (FLOMAX) capsule, 0.4 mg, Oral, Daily after Dinner  torsemide (DEMADEX) tablet, 10 mg, Oral, Daily        Physical Exam:  General:  81 year old man sitting up in bed, appropriately communicative and responsive.  In no apparent distress. Accompanied by family (wife, son)  HEENT:  Normocephalic, atraumatic, extraocular muscles intact.  Moist oral mucosa.    Neck:  Supple, no lymphadenopathy, no thyromegaly, trachea midline.    Cardiovascular:  Normal rate at 80 beats per minute, regular rhythm. Palpable radial pulses bilaterally.  Decreased dorsalis pedis pulses bilaterally.    Respiratory:  Good inspiratory effort.  No respiratory distress, normal air entry bilaterally. no wheezes, no rales, no rhonchi.  Abdomen:  Soft, non-tender, normoactive bowel sounds, no guarding no hepatomegaly, no splenomegaly, no ascites.    Extremities:  Palpable pulses bilaterally, Left upper extremity in a sling, no clubbing.  Skin:  Warm, dry, no ecchymosis, no petechiae, no jaundice, no lesions.  No pallor.  No cyanosis. Well healed mid sternal surgical scar. Left chest bandaged (covering pacemaker insertion site)  Neurological:  Awake, alert, oriented x3, no focal neurological  deficits.  Psychiatric:  Normal affect, appropriate mood.      Labs:  Results for orders placed or performed during the hospital encounter of 10/27/21 (from the past 24 hour(s))   BASIC METABOLIC PANEL   Result Value Ref Range    SODIUM 140 136 - 145 mmol/L    POTASSIUM 3.4 (L) 3.5 - 5.1 mmol/L    CHLORIDE 109 (H) 98 - 107 mmol/L    CO2 TOTAL 23 21 - 31 mmol/L    ANION GAP 8 (L) 10 - 20 mmol/L    CALCIUM 8.6 8.6 - 10.3 mg/dL    GLUCOSE 100 74 - 109 mg/dL    BUN 16 7 - 25 mg/dL    CREATININE 1.00 0.60 - 1.30 mg/dL    BUN/CREA RATIO 16 6 - 22    ESTIMATED GFR 75 >59 mL/min/1.91m2    OSMOLALITY, CALCULATED 281 270 - 290 mOsm/kg    Narrative    Estimated Glomerular Filtration Rate (eGFR) is calculated using the CKD-EPI (2021)  equation, intended for patients 12years of age and older. If gender is not documented or "unknown", there will be no eGFR calculation.   MAGNESIUM   Result Value Ref Range    MAGNESIUM 1.9 1.9 - 2.7 mg/dL            Imaging:    Results for orders placed or performed during the hospital encounter of 10/27/21 (from the past 24 hour(s))   FLUORO C ARM IN OR > 60 MINS     Status: None    Narrative    *Procedure not read by radiology.    *Please Refer to Procedure Note for result.   XR AP MOBILE CHEST     Status: None    Narrative    Tanner Tanner [name]    RADIOLOGIST: LDara Lords MD    XR AP MOBILE CHEST performed on 10/29/2021 3:49 PM    CLINICAL HISTORY: pacemaker.  post op pacemaker insertion    TECHNIQUE: Frontal view of the chest.    COMPARISON:  10/27/2021    FINDINGS:  A cardiac pacemaker is present. No pneumothorax.  Cardiac and mediastinal contours are stable.   Increased bronchopulmonary markings.   No definite consolidation or large effusion.        Impression    Mild congestive changes.      Radiologist location ID: [id number]          Microbiology:  No results found for any visits on 10/27/21 (from the past 96 hour(s)).        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  Echocardiogram revealed diastolic dysfunction.  Cardiology on board. Will likely need to follow up with Cards outpatient.    Likely suitable for discharge soon.      MY ORDERS LAST 24 (24h ago, onward)       Start     Ordered    10/30/21 0530  CBC/DIFF  (Order Panel)  0530 - AM DRAW (labs only)         10/29/21 1939    10/30/21 07544 BASIC METABOLIC PANEL  (Order Panel)  0530 - AM DRAW (labs only)         10/29/21 1939  10/29/21 1000  potassium chloride (K-DUR) extended release tablet  NOW         10/29/21 0859    10/29/21 0845   IP CONSULT TO GENERAL SURGERY On-Call Provider (nurse/clerk to determine)  ONE TIME,   Status:  Canceled        Provider:  (Not yet assigned)    10/29/21 0838    10/29/21 9381  BASIC METABOLIC PANEL  8299 - AM DRAW (labs only)         10/28/21 2354    10/29/21 0530  MAGNESIUM  0530 - AM DRAW (labs only)         10/28/21 2354                    Disposition Planning:   home    Lacinda Axon, [place]    This note was partially generated using MModal Fluency Direct system, and there may be some incorrect words, spellings, and punctuation that were not noted in checking the note before saving.

## 2021-10-29 NOTE — Anesthesia Transfer of Care (Signed)
ANESTHESIA TRANSFER OF CARE   Tanner [name] is a 81 y.o. ,male, Weight: 94.8 kg (209 lb 1.6 oz)   had Procedure(s):  DUAL CHAMBER PACEMAKER INSERTION WITH LEADS  performed  10/29/21   Primary Service: Lacinda Axon, MD    Past Medical History:   Diagnosis Date   . Disorder of thyroid    . Esophageal reflux    . Heart disease    . Hx of thyroid cancer    . Hyperlipidemia    . Hypertension    . Lung cancer (CMS [facility])       Allergy History as of 10/29/21       SULFA (SULFONAMIDES)         Noted Status Severity Type Reaction    10/13/21 [address] [place], [place] 01/10/17 Active Medium  Rash, Nausea/ Vomiting                  I completed my transfer of care / handoff to the receiving personnel during which we discussed:  Access, Airway, All key/critical aspects of case discussed, Analgesia, Antibiotics, Expectation of post procedure, Fluids/Product, Gave opportunity for questions and acknowledgement of understanding, Labs and PMHx  Report given to: Mickel Baas, RN    Post Location: PACU                                                           Last OR Temp: Temperature: 36.1 C (97 F)  ABG:  POTASSIUM   Date Value Ref Range Status   10/29/2021 3.4 (L) 3.5 - 5.1 mmol/L Final     CALCIUM   Date Value Ref Range Status   10/29/2021 8.6 8.6 - 10.3 mg/dL Final     Calculated P Axis   Date Value Ref Range Status   10/27/2021 -13 degrees Final     Calculated R Axis   Date Value Ref Range Status   10/27/2021 13 degrees Final     Calculated T Axis   Date Value Ref Range Status   10/27/2021 17 degrees Final     Airway:* No LDAs found *  Blood pressure (!) 150/89, pulse 82, temperature 36.1 C (97 F), resp. rate 20, height 1.829 m (6'), weight 94.8 kg (209 lb 1.6 oz), SpO2 98 %.

## 2021-10-30 DIAGNOSIS — Z9889 Other specified postprocedural states: Secondary | ICD-10-CM

## 2021-10-30 DIAGNOSIS — I495 Sick sinus syndrome: Secondary | ICD-10-CM | POA: Diagnosis present

## 2021-10-30 LAB — CBC WITH DIFF
BASOPHIL #: 0 10*3/uL (ref 0.00–0.30)
BASOPHIL %: 1 % (ref 0–3)
EOSINOPHIL #: 0.2 10*3/uL (ref 0.00–0.80)
EOSINOPHIL %: 3 % (ref 0–7)
HCT: 37.4 % — ABNORMAL LOW (ref 42.0–51.0)
HGB: 12.6 g/dL — ABNORMAL LOW (ref 13.5–18.0)
LYMPHOCYTE #: 1 10*3/uL — ABNORMAL LOW (ref 1.10–5.00)
LYMPHOCYTE %: 17 % — ABNORMAL LOW (ref 25–45)
MCH: 30.5 pg (ref 27.0–32.0)
MCHC: 33.6 g/dL (ref 32.0–36.0)
MCV: 90.9 fL (ref 78.0–99.0)
MONOCYTE #: 0.6 10*3/uL (ref 0.00–1.30)
MONOCYTE %: 11 % (ref 0–12)
MPV: 8.3 fL (ref 7.4–10.4)
NEUTROPHIL #: 4.2 10*3/uL (ref 1.80–8.40)
NEUTROPHIL %: 70 % (ref 40–76)
PLATELETS: 153 10*3/uL (ref 140–440)
RBC: 4.11 10*6/uL — ABNORMAL LOW (ref 4.20–6.00)
RDW: 14.8 % (ref 11.6–14.8)
WBC: 6 10*3/uL (ref 4.0–10.5)
WBCS UNCORRECTED: 6 10*3/uL

## 2021-10-30 LAB — BASIC METABOLIC PANEL
ANION GAP: 5 mmol/L — ABNORMAL LOW (ref 10–20)
BUN/CREA RATIO: 13 (ref 6–22)
BUN: 14 mg/dL (ref 7–25)
CALCIUM: 8.6 mg/dL (ref 8.6–10.3)
CHLORIDE: 108 mmol/L — ABNORMAL HIGH (ref 98–107)
CO2 TOTAL: 27 mmol/L (ref 21–31)
CREATININE: 1.1 mg/dL (ref 0.60–1.30)
ESTIMATED GFR: 67 mL/min/{1.73_m2} (ref >59)
GLUCOSE: 101 mg/dL (ref 74–109)
OSMOLALITY, CALCULATED: 280 mOsm/kg (ref 270–290)
POTASSIUM: 4.1 mmol/L (ref 3.5–5.1)
SODIUM: 140 mmol/L (ref 136–145)

## 2021-10-30 MED ORDER — METOPROLOL SUCCINATE ER 25 MG TABLET,EXTENDED RELEASE 24 HR
25.0000 mg | ORAL_TABLET | Freq: Every day | ORAL | 4 refills | Status: AC
Start: 2021-10-30 — End: ?

## 2021-10-30 NOTE — Progress Notes (Signed)
[facility]  General Surgery  Follow-Up Consultation    Date of Service:  10/30/2021  Tanner [name], Tanner [name], 81 y.o. male  Date of Admission:  10/27/2021  Date of Birth:  04/13/1940  PCP: Raye Sorrow, MD    HPI:  Post op dual-chamber pacemaker insertion.  Nursing reports no issues overnight    acetaminophen (TYLENOL) tablet, 650 mg, Oral, Q4H PRN  aluminum-magnesium hydroxide-simethicone (MAG-AL PLUS) 200-200-20 mg per 5 mL oral liquid, 10 mL, Oral, Q4H PRN  aspirin chewable tablet 81 mg, 81 mg, Oral, Daily  atorvastatin (LIPITOR) tablet, 80 mg, Oral, NIGHTLY  bromocriptine (PARLODEL) tablet, 2.5 mg, Oral, Daily  diphenhydrAMINE (BENADRYL) capsule, 50 mg, Oral, NIGHTLY  docusate sodium (COLACE) capsule, 100 mg, Oral, 2x/day PRN  enoxaparin PF (LOVENOX) 40 mg/0.4 mL SubQ injection, 40 mg, Subcutaneous, Q24H  HYDROcodone-acetaminophen (NORCO) 7.5 mg-325 mg per tablet, 1 Tablet, Oral, Q4H PRN  ipratropium-albuterol 0.5 mg-3 mg(2.5 mg base)/3 mL Solution for Nebulization, 3 mL, Nebulization, Q4H PRN  levothyroxine (SYNTHROID) tablet, 100 mcg, Oral, Daily  losartan (COZAAR) tablet, 25 mg, Oral, Daily  perflutren lipid microspheres (DEFINITY) 1.3 mL in NS 10 mL (tot vol) injection, 2 mL, Intravenous, Cardiology Once PRN  tamsulosin (FLOMAX) capsule, 0.4 mg, Oral, Daily after Dinner  torsemide (DEMADEX) tablet, 10 mg, Oral, Daily         Allergies   Allergen Reactions    Sulfa (Sulfonamides) Rash and Nausea/ Vomiting          Filed Vitals:    10/29/21 2357 10/30/21 0348 10/30/21 0722 10/30/21 0725   BP: (!) 145/82 (!) 137/92 (!) 158/91 109/87   Pulse: 60 60 60 96   Resp: 17 16     Temp: 36.6 C (97.9 F) 36.3 C (97.3 F) 36.3 C (97.3 F) 36.6 C (97.8 F)   SpO2: 93% 96% 97% 96%          Physical Exam  Constitutional:       Appearance: Normal appearance. He is not ill-appearing.   HENT:      Right Ear: Decreased hearing noted.      Left Ear: Decreased hearing noted.   Cardiovascular:      Rate and Rhythm: Normal  rate and regular rhythm.      Heart sounds: Normal heart sounds.   Chest:      Comments: Pacemaker insertion site, Steri-Strips intact, no edema, no erythema, no expressible drainage.  Wearing immobilizer.  Psychiatric:         Behavior: Behavior is cooperative.         Laboratory Data:     Results for orders placed or performed during the hospital encounter of 10/27/21 (from the past 24 hour(s))   BASIC METABOLIC PANEL   Result Value Ref Range    SODIUM 140 136 - 145 mmol/L    POTASSIUM 4.1 3.5 - 5.1 mmol/L    CHLORIDE 108 (H) 98 - 107 mmol/L    CO2 TOTAL 27 21 - 31 mmol/L    ANION GAP 5 (L) 10 - 20 mmol/L    CALCIUM 8.6 8.6 - 10.3 mg/dL    GLUCOSE 101 74 - 109 mg/dL    BUN 14 7 - 25 mg/dL    CREATININE 1.10 0.60 - 1.30 mg/dL    BUN/CREA RATIO 13 6 - 22    ESTIMATED GFR 67 >59 mL/min/1.56m^2    OSMOLALITY, CALCULATED 280 270 - 290 mOsm/kg   CBC WITH DIFF   Result Value Ref Range  WBCS UNCORRECTED 6.0 x10^3/uL    WBC 6.0 4.0 - 10.5 x10^3/uL    RBC 4.11 (L) 4.20 - 6.00 x10^6/uL    HGB 12.6 (L) 13.5 - 18.0 g/dL    HCT 37.4 (L) 42.0 - 51.0 %    MCV 90.9 78.0 - 99.0 fL    MCH 30.5 27.0 - 32.0 pg    MCHC 33.6 32.0 - 36.0 g/dL    RDW 14.8 11.6 - 14.8 %    PLATELETS 153 140 - 440 x10^3/uL    MPV 8.3 7.4 - 10.4 fL    NEUTROPHIL % 70 40 - 76 %    LYMPHOCYTE % 17 (L) 25 - 45 %    MONOCYTE % 11 0 - 12 %    EOSINOPHIL % 3 0 - 7 %    BASOPHIL % 1 0 - 3 %    NEUTROPHIL # 4.20 1.80 - 8.40 x10^3/uL    LYMPHOCYTE # 1.00 (L) 1.10 - 5.00 x10^3/uL    MONOCYTE # 0.60 0.00 - 1.30 x10^3/uL    EOSINOPHIL # 0.20 0.00 - 0.80 x10^3/uL    BASOPHIL # 0.00 0.00 - 0.30 x10^3/uL     No results found.       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  Immobilizer until return appointment follow up in office in 2 weeks.    Bridgett Larsson MD MBA CPE FACS      Sandi Raveling, FNP        This note was partially created using voice recognition software and is inherently subject to errors including those of syntax and "sound alike " substitutions which may escape proof reading. In such instances, original meaning may be extrapolated by contextual derivation.

## 2021-10-30 NOTE — Discharge Summary (Signed)
[facility]  DISCHARGE SUMMARY    PATIENT NAME:  Tanner [name], Tanner [name]  MRN:  [id number]  DOB:  01/16/1941    ENCOUNTER DATE:  10/27/2021  INPATIENT ADMISSION DATE: 10/28/2021  DISCHARGE DATE:  10/30/2021    ATTENDING PHYSICIAN: Beather Arbour, MD  SERVICE: PRN HOSPITALIST 2  PRIMARY CARE PHYSICIAN: Raye Sorrow, MD       No lay caregiver identified.    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          Current Discharge Medication List        START taking these medications.        Details   metoprolol succinate 25 mg Tablet Sustained Release 24 hr  Commonly known as: TOPROL-XL   25 mg, Oral, DAILY  Qty: 90 Tablet  Refills: 4            CONTINUE these medications - NO CHANGES were made during your visit.        Details   albuterol sulfate 90 mcg/actuation oral inhaler  Commonly known as: PROVENTIL or VENTOLIN or PROAIR   2 Puffs, Inhalation, DAILY  Refills: 0     aspirin 81 mg Tablet, Chewable   81 mg, Oral, DAILY  Refills: 0     atorvastatin 80 mg Tablet  Commonly known as: LIPITOR   1 Tablet, Oral, NIGHTLY  Refills: 0     bromocriptine 2.5 mg Tablet  Commonly known as: PARLODEL   2.5 mg, Oral, DAILY  Refills: 0     calcium carbonate-vitamin D3 600 mg-10 mcg (400 unit) Tablet   2 Tablets, Oral, DAILY  Refills: 0     diphenhydrAMINE 50 mg Capsule  Commonly known as: BENADRYL   1 Capsule, Oral, NIGHTLY  Refills: 0     fluticasone propion-salmeteroL 500-50 mcg/dose oral diskus inhaler  Commonly known as: ADVAIR   1 Puff, Inhalation, 2 TIMES DAILY  Refills: 0     ibuprofen 400 mg Tablet  Commonly  known as: MOTRIN   1 Tablet, Oral, 2 TIMES DAILY PRN  Refills: 0     ketotifen fumarate 0.025 % (0.035 %) Drops  Commonly known as: EYE ITCH RELIEF   1 Drop, Both Eyes, 2 TIMES DAILY  Refills: 0     levothyroxine 100 mcg Tablet  Commonly known as: SYNTHROID   100 mcg, Oral, DAILY  Refills: 0     losartan 50 mg Tablet  Commonly known as: COZAAR   25 mg, Oral, DAILY  Refills: 0     omeprazole 20 mg Capsule, Delayed Release(E.C.)  Commonly known as: PRILOSEC   20 mg, Oral, DAILY  Refills: 0     tamsulosin 0.4 mg Capsule  Commonly known as: FLOMAX   0.4 mg, Oral, EVERY EVENING AFTER DINNER  Qty: 30 Capsule  Refills: 5     torsemide 20 mg Tablet  Commonly known as: DEMADEX   0.5 Tablets, Oral, DAILY  Refills: 0            Discharge  med list refreshed?  YES     Allergies   Allergen Reactions    Sulfa (Sulfonamides) Rash and Nausea/ Vomiting     HOSPITAL PROCEDURE(S):   No orders of the defined types were placed in this encounter.    Surgical/Procedural Cases on this Admission       Case IDs Date Procedure Surgeon Location Status    [phone] 10/29/21 DUAL CHAMBER PACEMAKER INSERTION WITH LEADS Shirlee More, MD PRN OR MAIN Comp          REASON FOR HOSPITALIZATION AND HOSPITAL COURSE   BRIEF HPI:  This is a 81 y.o., male admitted for bradycardia.  HPI from 10/27/2021:Tanner [name] is a 81 y.o., White male, White male who presents with bradycardia.  He is a past medical history significant for hypothyroidism, lung cancer, status post lobectomy, coronary artery disease, status post mitral valve repair and CABG, hypertension, hyperlipidemia who presented to the emergency room after bradycardia was incidentally discovered while he was undergoing evaluation for procedure.  He denies any previous knowledge of low heart rate.  He does note that he is had some shortness of breath that has been gradually worsening over the last 2 years.  He states that he had an echocardiogram, but notes that it was over a year ago.  He also notes that he is had  some lower extremity swelling, which he has had for at least a year and notes that he takes a "water pill" for it.  He denies any notable recent change in his symptoms.  He denies any chest pain, chest pressure, jaw pain, arm pain, worsening shortness of breath.  He does note that he has difficulty with breathing with mild exertion.  He notes that he is unable to walk up a flight of stairs or walk a complete block on flat ground without feeling short of breath.  His son is in the room and assists in providing history.  His son notes that he notices his father becomes short of breath after walking maybe 100 ft. He denies any paroxysmal nocturnal dyspnea.   BRIEF HOSPITAL NARRATIVE:     Patient was admitted and Cardiology consulted, they determined he would need a pacemaker placed.  Dual-chamber pacemaker insertion on 10/28/2021 per Dr. Venia Minks.  Followed by Dr. Hessie Dibble who did recommend starting a beta-blocker back postprocedure with metoprolol 25 mg p.o. daily and follow-up in his office.  Patient had shoulder immobilizer in place.  Denies any complaints.  We will follow up with Cardiology on an outpatient basis.  Patient is cleared for discharge.      TRANSITION/POST DISCHARGE CARE/PENDING TESTS/REFERRALS:   Follow-up with Dr. Hessie Dibble in 2 weeks  F/u with Dr. Venia Minks in 2 weeks    CONDITION ON DISCHARGE:  A. Ambulation: Full ambulation  B. Self-care Ability: With partial assistance  C. Cognitive Status Alert and Oriented x 3  D. Code status at discharge:       LINES/DRAINS/WOUNDS AT DISCHARGE:   Patient Lines/Drains/Airways Status       Active Line / Dialysis Catheter / Dialysis Graft / Drain / Airway / Wound       Name Placement date Placement time Site Days    Peripheral IV Posterior;Right 10/27/21  1614  -- 2    Surgical Incision Chest 10/29/21  1316  -- less than 1                    DISCHARGE DISPOSITION:  Home discharge  DISCHARGE INSTRUCTIONS:  Post-Discharge Follow  Up Appointments       Follow up with Raye Sorrow, MD in 1 week(s)    Phone: [phone]    Where: [address] [place] [address]    Follow up with Hewitt Shorts, MD in 2 week(s)    Phone: [phone]    Where: [facility]      Thursday Nov 12, 2021    Return Patient Visit with Landis Gandy, DO at 10:30 AM      Monday Nov 16, 2021    Post-Operative Visit with Shirlee More, MD at  9:00 AM    Follow up with Shirlee More, MD    Phone: [phone]    Where: [facility] [place]  [address]   [place] [address]  [phone] [facility], [place], [place] [address]  [phone]          No discharge procedures on file.   Pt. Was seen and examined along with Dr. Liliane Shi. Chart and plan of care were reviewed and discussed in detail.    Heather Redden, FNP-BC    Patient was seen and examined.  Discharge summary as above.  Patient to follow up with Cardiology    Copies sent to Care Team         Relationship Specialty Notifications Start End    Raye Sorrow, MD PCP - General INTERNAL MEDICINE  10/12/21     Phone: [phone] Fax: [phone]         [place] [address]            Referring providers can utilize [email or web address] to access their referred [place] patient's information.

## 2021-10-30 NOTE — Consults (Signed)
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 male had the pacemaker insertion for AV dissociation and is feeling better and is anxious to go home    Vital Signs:  Temp (24hrs) Max:36.6 C (97.9 F)      Temperature: 36.6 C (97.8 F)  BP (Non-Invasive): (!) 142/73  MAP (Non-Invasive): 100 mmHG  Heart Rate: 95  Respiratory Rate: 16  SpO2: 92 %    Current Medications:  acetaminophen (TYLENOL) tablet, 650 mg, Oral, Q4H PRN  aluminum-magnesium hydroxide-simethicone (MAG-AL PLUS) 200-200-20 mg per 5 mL oral liquid, 10 mL, Oral, Q4H PRN  aspirin chewable tablet 81 mg, 81 mg, Oral, Daily  atorvastatin (LIPITOR) tablet, 80 mg, Oral, NIGHTLY  bromocriptine (PARLODEL) tablet, 2.5 mg, Oral, Daily  diphenhydrAMINE (BENADRYL) capsule, 50 mg, Oral, NIGHTLY  docusate sodium (COLACE) capsule, 100 mg, Oral, 2x/day PRN  enoxaparin PF (LOVENOX) 40 mg/0.4 mL SubQ injection, 40 mg, Subcutaneous, Q24H  HYDROcodone-acetaminophen (NORCO) 7.5 mg-325 mg per tablet, 1 Tablet, Oral, Q4H PRN  ipratropium-albuterol 0.5 mg-3 mg(2.5 mg base)/3 mL Solution for Nebulization, 3 mL, Nebulization, Q4H PRN  levothyroxine (SYNTHROID) tablet, 100 mcg, Oral, Daily  losartan (COZAAR) tablet, 25 mg, Oral, Daily  perflutren lipid microspheres (DEFINITY) 1.3 mL in NS 10 mL (tot vol) injection, 2 mL, Intravenous, Cardiology Once PRN  tamsulosin (FLOMAX) capsule, 0.4 mg, Oral, Daily after Dinner  torsemide (DEMADEX) tablet, 10 mg, Oral, Daily            Today's Physical Exam:  Physical Exam .  Alert and comfortable not in any kind of distress.  Heart sounds are regular lungs are clear.        I/O:  I/O last 24 hours:    Intake/Output Summary (Last 24 hours) at 10/30/2021 1051  Last data filed at  10/30/2021 0900  Gross per 24 hour   Intake 810 ml   Output 350 ml   Net 460 ml     I/O current shift:  08/04 0700 - 08/04 1859  In: 360 [P.O.:360]  Out: -       Labs  Please indicate ordered or reviewed)  Reviewed:     BMP:    Basic Metabolic Profile    Lab Results   Component Value Date/Time    SODIUM 140 10/30/2021 06:50 AM    POTASSIUM 4.1 10/30/2021 06:50 AM    CHLORIDE 108 (H) 10/30/2021 06:50 AM    CO2 27 10/30/2021 06:50 AM    ANIONGAP 5 (L) 10/30/2021 06:50 AM    Lab Results   Component Value Date/Time    BUN 14 10/30/2021 06:50 AM    CREATININE 1.10 10/30/2021 06:50 AM    GLUCOSENF 101 10/30/2021 06:50 AM          CMP: @LASTCMP @   CBC: COMPLETE BLOOD COUNT   Lab Results   Component Value Date    WBC 6.0 10/30/2021    HGB 12.6 (L) 10/30/2021    HCT 37.4 (L) 10/30/2021    PLTCNT 153 10/30/2021       DIFFERENTIAL  Lab Results   Component Value Date    PMNS 70 10/30/2021  LYMPHOCYTES 17 (L) 10/30/2021    MONOCYTES 11 10/30/2021    EOSINOPHIL 3 10/30/2021    BASOPHILS 1 10/30/2021    BASOPHILS 0.00 10/30/2021    PMNABS 4.20 10/30/2021    LYMPHSABS 1.00 (L) 10/30/2021    EOSABS 0.20 10/30/2021    MONOSABS 0.60 10/30/2021        Hepatic Function:   Hepatic Function    Lab Results   Component Value Date/Time    ALBUMIN 3.6 10/27/2021 04:20 PM    TOTALPROTEIN 6.1 (L) 10/27/2021 04:20 PM    ALKPHOS 91 10/27/2021 04:20 PM    Lab Results   Component Value Date/Time    AST 14 10/27/2021 04:20 PM    ALT 10 10/27/2021 04:20 PM             Magnesium: MAGNESIUM  Lab Results   Component Value Date    MAGNESIUM 1.9 10/29/2021        PT: COAGULATION TESTS  Lab Results   Component Value Date    HCT 37.4 (L) 10/30/2021    PLTCNT 153 10/30/2021          INR:   No results found for this encounter  PTT:   No results found for this encounter  TROPONIN I  Lab Results   Component Value Date    TROPONINI 13 10/27/2021    TROPONINI 13 10/27/2021    TROPONINI 11 10/27/2021          Lab Results   Component Value Date    BNP 366 (H)  10/27/2021        Last 3 Cardiac Markers: No results found for this encounter  Blood Gas: No results found for this encounter  Lipid Panel:  No results found for this encounter  TSH:  No results found for this encounter      Diagnostic Tests (Please indicate ordered or reviewed)  Reviewed:     No results found for this or any previous visit.   @LASTECHORESULTS @   @LASTECHO @     Radiology Tests   Reviewed:     XR AP MOBILE CHEST   Final Result   Mild congestive changes.         Radiologist location ID: [id number]         FLUORO C ARM IN OR > 60 MINS   Final Result      XR AP MOBILE CHEST   Final Result      1. Hazy right basilar and left perihilar opacities new from October 03, 2018.   2. Stable postsurgical cardiomegaly with background findings suggestive of underlying COPD.         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      1. 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      2. Coronary artery disease status post bypass surgery 2017    3. Diastolic left ventricular impairment       Plan:   Will follow him in office.  Will add a beta-blocker metoprolol succinate 25 mg a day with history of tachycardia in the past.  Thank you      Hewitt Shorts, MD

## 2021-10-30 NOTE — Nurses Notes (Signed)
Discharge instructions given. Patient and spouse verbalized understanding. IV access removed. Catheter intact. No active bleeding noted. Pressure dressing applied. All personal belongings packed.

## 2021-10-30 NOTE — Care Management Notes (Signed)
Patient to discharge to home today.   On contact, patient is alert and oriented X 3.  Wife at bedside.   No DC needs identified.   Discharge IM Letter signed this am.

## 2021-11-02 ENCOUNTER — Telehealth (HOSPITAL_COMMUNITY): Payer: Self-pay

## 2021-11-12 ENCOUNTER — Other Ambulatory Visit: Payer: Self-pay

## 2021-11-12 ENCOUNTER — Encounter (INDEPENDENT_AMBULATORY_CARE_PROVIDER_SITE_OTHER): Payer: Self-pay | Admitting: Student in an Organized Health Care Education/Training Program

## 2021-11-12 ENCOUNTER — Ambulatory Visit (INDEPENDENT_AMBULATORY_CARE_PROVIDER_SITE_OTHER): Payer: 59 | Admitting: Student in an Organized Health Care Education/Training Program

## 2021-11-12 VITALS — BP 106/54 | HR 64 | Ht 72.0 in | Wt 206.0 lb

## 2021-11-12 DIAGNOSIS — N2889 Other specified disorders of kidney and ureter: Secondary | ICD-10-CM

## 2021-11-12 DIAGNOSIS — Z125 Encounter for screening for malignant neoplasm of prostate: Secondary | ICD-10-CM

## 2021-11-12 DIAGNOSIS — N401 Enlarged prostate with lower urinary tract symptoms: Secondary | ICD-10-CM

## 2021-11-12 DIAGNOSIS — R399 Unspecified symptoms and signs involving the genitourinary system: Secondary | ICD-10-CM

## 2021-11-12 DIAGNOSIS — Z85118 Personal history of other malignant neoplasm of bronchus and lung: Secondary | ICD-10-CM

## 2021-11-12 DIAGNOSIS — N400 Enlarged prostate without lower urinary tract symptoms: Secondary | ICD-10-CM

## 2021-11-12 DIAGNOSIS — Z85850 Personal history of malignant neoplasm of thyroid: Secondary | ICD-10-CM

## 2021-11-12 NOTE — Progress Notes (Signed)
[place]    Progress Note    Name: Tanner [name] MRN:  [id number]   Date: 11/12/2021 Age: 81 y.o.       Chief Complaint: Follow Up    Subjective:   Tanner [name] is a pleasant 81 year old man who presents with a recent abnormal MRI demonstrating a left enhancing renal mass measuring 3 cm.  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  He denies a family history of urologic malignancy.   He was recently scheduled for renal mass biopsy however this was canceled due to an abnormal heart rhythm requiring a pacemaker which was placed on August 3rd.  He was also started on Flomax for his BPH symptoms.      Objective :  BP (!) 106/54 (Site: Right, Patient Position: Sitting, Cuff Size: Adult)   Pulse 64   Ht 1.829 m (6')   Wt 93.4 kg (206 lb)   BMI 27.94 kg/m       Gen: NAD, alert  Pulm: unlabored at rest  CV: palpable pulses  Abd: soft, Nt/ND  GU: no suprapubic tenderness, no CVAT    Data reviewed:    Current Outpatient Medications   Medication Sig    albuterol sulfate (PROVENTIL OR VENTOLIN OR PROAIR) 90 mcg/actuation Inhalation oral inhaler Take 2 Puffs by inhalation Once a day    aspirin 81 mg Oral Tablet, Chewable Chew 1 Tablet (81 mg total) Once a day    atorvastatin (LIPITOR) 80 mg Oral Tablet Take 1 Tablet (80 mg total) by mouth Every night    bromocriptine (PARLODEL) 2.5 mg Oral Tablet Take 1 Tablet (2.5 mg total) by mouth Once a day    calcium carbonate-vitamin D3 600 mg-10 mcg (400 unit) Oral Tablet Take 2 Tablets by mouth Once a day    diphenhydrAMINE (BENADRYL) 50 mg Oral Capsule Take 1 Capsule (50 mg total) by mouth Every night    fluticasone propion-salmeteroL (ADVAIR) 500-50 mcg/dose Inhalation oral diskus inhaler Take 1 Puff (1 INHALATION  total) by inhalation Twice daily    ibuprofen (MOTRIN) 400 mg Oral Tablet Take 1 Tablet (400 mg total) by mouth Twice per day as needed     ketotifen fumarate (EYE ITCH RELIEF) 0.025 % (0.035 %) Ophthalmic Drops Instill 1 Drop into both eyes Twice daily    levothyroxine (SYNTHROID) 100 mcg Oral Tablet Take 1 Tablet (100 mcg total) by mouth Once a day    losartan (COZAAR) 50 mg Oral Tablet Take 0.5 Tablets (25 mg total) by mouth Once a day    metoprolol succinate (TOPROL-XL) 25 mg Oral Tablet Sustained Release 24 hr Take 1 Tablet (25 mg total) by mouth Once a day    omeprazole (PRILOSEC) 20 mg Oral Capsule, Delayed Release(E.C.) Take 1 Capsule (20 mg total) by mouth Once a day    tamsulosin (FLOMAX) 0.4 mg Oral Capsule Take 1 Capsule (0.4 mg total) by mouth Every evening after dinner    torsemide (DEMADEX) 20 mg Oral Tablet Take 0.5 Tablets (10 mg total) by mouth Once a day     Assessment/Plan  Problem List Items Addressed This Visit    None    Left enhancing renal mass, 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 Importantly, the patient's  renal cell carcinoma-specific risk factors include: hypertension, obesity, previous nicotine dependence     Patient was educated and counseled on the incidence, nature and risk factors for renal cell carcinoma; notably, I stressed the 80-90% probability that a solid mass of this nature and imaging characteristics represents malignancy. Treatment options were discussed including observation, percutaneous biopsy, radical nephrectomy (open v. laparoscopic), partial nephrectomy (open v. laparoscopic), thermal ablation (open v. laparoscopic v. percutaneous). The risks, advantages, and disadvantages of each approach were discussed in detail.      We briefly discussed the role of percutaneous, image-guided renal mass biopsy in patients in whom it might impact management, particularly in patients with clinical or radiographic findings suggestive of lymphoma, abscess or metastasis.  We also discussed the  limitations of renal mass biopsy including the non-diagnostic yield and hybrid-type tumors which may lend a false sense of reassurance of a perceived indolent tumor such as oncocytoma.  Additionally, renal mass biopsy is not without risks with well-documented risks of retroperitoneal hemorrhage and possible, albeit rare, biopsy tract seeding.     For clinical T1a renal masses (<4.0 cm) in an otherwise health patient without coexistant medical comorbidities or baseline chronic kidney disease, complete surgical excision achieved by either partial nephrectomy or radical nephrectomy when nephron sparing surgery is not technically feasible are considered the standard of care per the 2009 American Urological Association "Guideline for the Management of the Clinical Stage 1 Renal Mass".  Partial nephrectomy, if possible, is recommended when the need exists to preserve renal function, however, can be associated with increased urologic morbidity including higher risk of hemorrhage and urinary fistula.     For clinical T1b+ (>4 cm) renal masses in an otherwise healthy patient, both radical nephrectomy in the setting of a normal contralateral kidney or partial nephrectomy when the need exists to preserve renal function are considered standards, each given equal weight per the 2009 American Urological Association "Guideline for the Management of the Clinical Stage 1 Renal Mass".  I spent time discussing with patient that the overwhelming majority of studies evaluating partial nephrectomy in higher stage (cT1b+) are observational, retrospective, reported findings on samples of convenience that were not randomized to treatments and involved only one treatment group. There are inherent, unknownand unquantifiable biases within each study because of the lack of randomization and precludes confident evidence-based guideline directives       Patient's potential risk factors for the development of chronic kidney disease include:   hypertension, cardiovascular disease, nicotine dependence, obesity, dyslipidemia, positive family history of kidney disease, advanced age > 77 years.  We discussed the understood risks of either open or minimally invasive renal surgery including bleeding requiring blood transfusion, infection, adjacent organ injury, urine leak, positive margins, severe pain and standard operative risks such as myocardial infarction, stroke, venous thromboembolism (VTE), pneumonia, PE and even a 0.2% chance of death.       Thermal ablative therapies, either cryoablation or radiofrequency ablation, are considered less-invasive treatment options, however, may be associated with slightly higher risk of local tumor recurrence and more challenging surgical salvage; additionally, measures of success are less well-defined.    Compared to surgical extirpation, there are inherent, unknownand unquantifiable biases within the evidence for thermal ablation due to the lack of randomization and precludes confident evidence-based guideline directives.     Active surveillance with delayed intervention was also discussed with patient  as an option in patients who want to avoid surgery and are willing to accept an increased risk of tumor progression  compared to either radical nephrectomy or partial nephrectomy.  Ideally, active surveillance is best utilized in a small renal mass (<4 cm) in a patient with high operative and anesthetic risks.     At this time given his history of 2 other malignancies which were rather aggressive per the patient's report we will need to confirm that this is renal cell carcinoma versus a metastatic focus.  A CT renal mass biopsy was ordered.  The patient was instructed to discontinue his aspirin 7 days before and resume his aspirin 7 days after the biopsy.  He will follow-up following biopsy for further discussion about next best steps.        BPH/LUTS on flomax  Discussed the differential diagnosis, pathophysiology and  nature of benign prostate enlargement causing lower urinary tract symptoms  Counseled patient on conservative management options including appropriate fluid management, avoidance of diuretics including caffeine and alcohol  Continue Tamsulosin Hydrochloride (FlomaxT) 0.4 mg P.O. daily:    I have discussed in great detail with the patient the treatment of prostate enlargement/lower urinary tract symptoms using tamsulosin.  I have explained my rationale for using tamsulosin as well as potential risks of asthenia (2%), dizziness (5%), rhinitis (3%) and abnormal ejaculation (11%). I encouraged patient to report any prior or current history of alpha-1 antagonist use to their opthalmic surgeon before undergoing any eye surgery due to the risk of intraoperative floppy iris syndrome.  The patient expressed an understanding of the treatment, possible reactions, and possible prognosis.           Prostate cancer screening  Based on patient's clinical findings including his recent prostate specific antigen level, age, ethnicity and relevant risk factors and in accordance with the American Urological Association (AUA) & Rogers (NCCN) published screening guidelines, I would recommend the following:  No further prostate specific antigen screening in men over age 48 due to a higher prevalence of prostate cancer but greater risk of competing diseases and overdiagnosis when compared to younger men and strong evidence for a lack of treatment benefit for men in this age group        Landis Gandy, DO     A combined total of 45 minutes were spent preparing to see the patient, reviewing previous records, ordering tests/medications/procedures, documenting the clinical encounter as well as performing a medically appropriate evaluation and independently interpreting results and communicating them to the patient/family/caregiver as specifically outlined above in the impression and plan.

## 2021-11-16 ENCOUNTER — Ambulatory Visit (INDEPENDENT_AMBULATORY_CARE_PROVIDER_SITE_OTHER): Payer: 59 | Admitting: Surgery

## 2021-11-16 ENCOUNTER — Other Ambulatory Visit: Payer: Self-pay

## 2021-11-16 ENCOUNTER — Encounter (INDEPENDENT_AMBULATORY_CARE_PROVIDER_SITE_OTHER): Payer: Self-pay | Admitting: Surgery

## 2021-11-16 VITALS — BP 144/85 | HR 69 | Temp 96.6°F | Resp 18 | Ht 72.0 in | Wt 212.0 lb

## 2021-11-16 DIAGNOSIS — Z950 Presence of cardiac pacemaker: Secondary | ICD-10-CM

## 2021-11-16 NOTE — Progress Notes (Unsigned)
GENERAL SURGERY, [place] [address]  [place] [address]       Name: Tanner [name] MRN:  [id number]   Date: 11/16/2021 Age: 82 y.o. 03-03-1941      PCP: Raye Sorrow, MD     Subjective  Tanner [name] is a 81 y.o. year old male who presents for follow up of  Dual chamber pacemaker on October 29, 2021.    Patient pleased with their postoperative progress at this point.  No chest pain.  No syncopal episodes.     Preoperative symptoms have resolved.    No problems from incision sites.  Has maintained left arm in sling since that time    Patient Active Problem List    Diagnosis Date Noted    Chronic diastolic congestive heart failure (CMS [place]) 10/28/2021    Hypertension 10/27/2021    Coronary artery disease 10/27/2021        Current Outpatient Medications   Medication Sig    albuterol sulfate (PROVENTIL OR VENTOLIN OR PROAIR) 90 mcg/actuation Inhalation oral inhaler Take 2 Puffs by inhalation Once a day    aspirin 81 mg Oral Tablet, Chewable Chew 1 Tablet (81 mg total) Once a day    atorvastatin (LIPITOR) 80 mg Oral Tablet Take 1 Tablet (80 mg total) by mouth Every night    bromocriptine (PARLODEL) 2.5 mg Oral Tablet Take 1 Tablet (2.5 mg total) by mouth Once a day    calcium carbonate-vitamin D3 600 mg-10 mcg (400 unit) Oral Tablet Take 2 Tablets by mouth Once a day    diphenhydrAMINE (BENADRYL) 50 mg Oral Capsule Take 1 Capsule (50 mg total) by mouth Every night    fluticasone propion-salmeteroL (ADVAIR) 500-50 mcg/dose Inhalation oral diskus inhaler Take 1 Puff (1 INHALATION  total) by inhalation Twice daily    ibuprofen (MOTRIN) 400 mg Oral Tablet Take 1 Tablet (400 mg total) by mouth Twice per day as needed    ketotifen fumarate (EYE ITCH RELIEF) 0.025 % (0.035 %) Ophthalmic Drops Instill 1 Drop into both eyes Twice daily    levothyroxine (SYNTHROID) 100 mcg Oral Tablet Take 1 Tablet (100 mcg total) by mouth Once a day    losartan (COZAAR) 50 mg Oral Tablet Take 0.5  Tablets (25 mg total) by mouth Once a day    metoprolol succinate (TOPROL-XL) 25 mg Oral Tablet Sustained Release 24 hr Take 1 Tablet (25 mg total) by mouth Once a day    omeprazole (PRILOSEC) 20 mg Oral Capsule, Delayed Release(E.C.) Take 1 Capsule (20 mg total) by mouth Once a day    tamsulosin (FLOMAX) 0.4 mg Oral Capsule Take 1 Capsule (0.4 mg total) by mouth Every evening after dinner    torsemide (DEMADEX) 20 mg Oral Tablet Take 0.5 Tablets (10 mg total) by mouth Once a day          Objective:   There were no vitals taken for this visit.         General:appropriate for age. in no acute distress.    Vital signs are present above and have been reviewed by me     Chest wall ncision looks appropriate for postoperative.  No erythema or expressible drainage.  Good cosmetic result.  Sutures removed    Assessment/Plan    Assessment/Plan   1. Pacemaker        Patient is pleased with the results.   Preoperative symptoms have resolved.  I would be happy to see again  at any time.  Opportunity was given for questions, and none were voiced past the above discussion.  The patient is free to return at any time for further questions and or difficulties.  Has established cardiology follow-up.    Bridgett Larsson MD MBA CPE FACS

## 2021-11-16 NOTE — Nursing Note (Signed)
Patient presents today for post op pacemaker. Sutures removed without difficulty. Tanner Nigh, LPN  9/[id number] 85:50

## 2021-11-17 ENCOUNTER — Encounter (INDEPENDENT_AMBULATORY_CARE_PROVIDER_SITE_OTHER): Payer: Self-pay | Admitting: Surgery

## 2021-12-01 ENCOUNTER — Ambulatory Visit
Admission: RE | Admit: 2021-12-01 | Discharge: 2021-12-01 | Disposition: A | Discharge status: Home / Self Care | Payer: 59 | Source: Ambulatory Visit | Attending: Student in an Organized Health Care Education/Training Program | Admitting: Student in an Organized Health Care Education/Training Program

## 2021-12-01 ENCOUNTER — Encounter (HOSPITAL_COMMUNITY): Payer: Self-pay

## 2021-12-01 ENCOUNTER — Other Ambulatory Visit: Payer: Self-pay

## 2021-12-01 DIAGNOSIS — N2889 Other specified disorders of kidney and ureter: Secondary | ICD-10-CM | POA: Insufficient documentation

## 2021-12-01 DIAGNOSIS — N269 Renal sclerosis, unspecified: Secondary | ICD-10-CM | POA: Insufficient documentation

## 2021-12-01 LAB — PLATELET FUNCTION COLLAGEN: PFA COLLAGEN/EPI RESULT: 135 seconds (ref 84–152)

## 2021-12-01 MED ORDER — SODIUM CHLORIDE 0.9 % INTRAVENOUS SOLUTION
INTRAVENOUS | Status: DC
Start: 2021-12-01 — End: 2021-12-02

## 2021-12-01 MED ORDER — MIDAZOLAM 5 MG/ML INJECTION WRAPPER
Freq: Once | INTRAMUSCULAR | Status: AC | PRN
Start: 2021-12-01 — End: 2021-12-01
  Administered 2021-12-01: 2 mg via INTRAVENOUS

## 2021-12-01 MED ORDER — FENTANYL (PF) 50 MCG/ML INJECTION WRAPPER
INJECTION | Freq: Once | INTRAMUSCULAR | Status: AC | PRN
Start: 2021-12-01 — End: 2021-12-01
  Administered 2021-12-01: 25 ug via INTRAVENOUS

## 2021-12-01 MED ORDER — FENTANYL (PF) 50 MCG/ML INJECTION SOLUTION
INTRAMUSCULAR | Status: AC
Start: 2021-12-01 — End: 2021-12-01
  Filled 2021-12-01: qty 2

## 2021-12-01 MED ORDER — MIDAZOLAM 5 MG/ML INJECTION WRAPPER
INTRAMUSCULAR | Status: AC
Start: 2021-12-01 — End: 2021-12-01
  Filled 2021-12-01: qty 1

## 2021-12-01 MED ORDER — HYDROCODONE 7.5 MG-ACETAMINOPHEN 325 MG TABLET
1.0000 | ORAL_TABLET | Freq: Four times a day (QID) | ORAL | Status: DC | PRN
Start: 2021-12-01 — End: 2021-12-02

## 2021-12-01 NOTE — Discharge Instructions (Addendum)
Keep any and all follow up appointments with Dr. Despina Pole.    You may shower starting tomorrow.     NO heavy lifting for 7 days.     Monitor your incision site for swelling, foul smelling drainage, or uncontrollable bleeding. If  this occurs call Dr. Norm Parcel office. If it is after hours or the weekend please go to your nearest ER.     If you experience chest pain or sudden shortness of breath please go to your nearest ER immediately.    You may resume your home medications as prescribed.

## 2021-12-01 NOTE — Brief Op Note (Signed)
12/01/21      Radiologist: Dr. Lamar Blinks    Procedure: CT BIOPSY RENAL    Description of Procedure Findings:      LEFT RENAL MASS        Estimated Blood Loss:  1.0 ML        Specimen Collected:  8 CORE BIOPSY SPECIMENS            Diagnosis: Renal mass; AS ABOVE; DICTATED REPORT TO FOLLOW      Lamar Blinks, MD

## 2021-12-02 DIAGNOSIS — N289 Disorder of kidney and ureter, unspecified: Secondary | ICD-10-CM

## 2021-12-02 LAB — SURGICAL PATHOLOGY SPECIMEN

## 2021-12-04 ENCOUNTER — Ambulatory Visit (INDEPENDENT_AMBULATORY_CARE_PROVIDER_SITE_OTHER): Payer: 59 | Admitting: Student in an Organized Health Care Education/Training Program

## 2021-12-04 ENCOUNTER — Other Ambulatory Visit: Payer: Self-pay

## 2021-12-04 ENCOUNTER — Other Ambulatory Visit
Payer: 59 | Attending: Student in an Organized Health Care Education/Training Program | Admitting: Student in an Organized Health Care Education/Training Program

## 2021-12-04 ENCOUNTER — Ambulatory Visit (INDEPENDENT_AMBULATORY_CARE_PROVIDER_SITE_OTHER): Payer: 59

## 2021-12-04 ENCOUNTER — Encounter (INDEPENDENT_AMBULATORY_CARE_PROVIDER_SITE_OTHER): Payer: Self-pay | Admitting: Student in an Organized Health Care Education/Training Program

## 2021-12-04 VITALS — BP 146/85 | HR 70 | Ht 72.0 in | Wt 209.0 lb

## 2021-12-04 DIAGNOSIS — Z125 Encounter for screening for malignant neoplasm of prostate: Secondary | ICD-10-CM

## 2021-12-04 DIAGNOSIS — N400 Enlarged prostate without lower urinary tract symptoms: Secondary | ICD-10-CM

## 2021-12-04 DIAGNOSIS — N401 Enlarged prostate with lower urinary tract symptoms: Secondary | ICD-10-CM

## 2021-12-04 DIAGNOSIS — N2889 Other specified disorders of kidney and ureter: Secondary | ICD-10-CM

## 2021-12-04 LAB — PSA, DIAGNOSTIC: PSA: 2.41 ng/mL (ref <=4.00)

## 2021-12-04 NOTE — Progress Notes (Signed)
[place], [facility]    Progress Note    Name: Tanner [name] MRN:  [id number]   Date: 12/04/2021 Age: 81 y.o.       Chief Complaint: Follow-up After Testing (Bx of kidney 12/01/21 )    Subjective:   Mr. Okey is a pleasant 81 year old man who presents with a recent abnormal MRI demonstrating a left enhancing renal mass measuring 3 cm.  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  He recently underwent a renal mass biopsy which demonstrated fibrosis and rare atypical cells.  He has been doing well since his biopsy and denies any complaints.    Objective :  BP (!) 146/85 (Site: Left, Patient Position: Sitting, Cuff Size: Adult)   Pulse 70   Ht 1.829 m (6')   Wt 94.8 kg (209 lb)   SpO2 99%   BMI 28.35 kg/m       Gen: NAD, alert  Pulm: unlabored at rest  CV: palpable pulses  Abd: soft, Nt/ND  GU: no suprapubic tenderness, no CVAT    Data reviewed:    Current Outpatient Medications   Medication Sig    albuterol sulfate (PROVENTIL OR VENTOLIN OR PROAIR) 90 mcg/actuation Inhalation oral inhaler Take 2 Puffs by inhalation Once a day    aspirin 81 mg Oral Tablet, Chewable Chew 1 Tablet (81 mg total) Once a day    atorvastatin (LIPITOR) 80 mg Oral Tablet Take 1 Tablet (80 mg total) by mouth Every night    bromocriptine (PARLODEL) 2.5 mg Oral Tablet Take 1 Tablet (2.5 mg total) by mouth Once a day    calcium carbonate-vitamin D3 600 mg-10 mcg (400 unit) Oral Tablet Take 2 Tablets by mouth Once a day    diphenhydrAMINE (BENADRYL) 50 mg Oral Capsule Take 1 Capsule (50 mg total) by mouth Every night    fluticasone propion-salmeteroL (ADVAIR) 500-50 mcg/dose Inhalation oral diskus inhaler Take 1 Puff (1 INHALATION  total) by inhalation Twice daily    ibuprofen (MOTRIN) 400 mg Oral Tablet Take 1 Tablet (400 mg total) by mouth Twice per day as needed    ketotifen fumarate (EYE ITCH RELIEF) 0.025 % (0.035 %)  Ophthalmic Drops Instill 1 Drop into both eyes Twice daily    levothyroxine (SYNTHROID) 100 mcg Oral Tablet Take 1 Tablet (100 mcg total) by mouth Once a day    losartan (COZAAR) 50 mg Oral Tablet Take 0.5 Tablets (25 mg total) by mouth Once a day    metoprolol succinate (TOPROL-XL) 25 mg Oral Tablet Sustained Release 24 hr Take 1 Tablet (25 mg total) by mouth Once a day    omeprazole (PRILOSEC) 20 mg Oral Capsule, Delayed Release(E.C.) Take 1 Capsule (20 mg total) by mouth Once a day    tamsulosin (FLOMAX) 0.4 mg Oral Capsule Take 1 Capsule (0.4 mg total) by mouth Every evening after dinner    torsemide (DEMADEX) 20 mg Oral Tablet Take 0.5 Tablets (10 mg total) by mouth Once a day     Assessment/Plan  Problem List Items Addressed This Visit    None    Left enhancing renal mass, 3.1cm, with history of lung and thyroid cancer s/p renal mass biopsy on 12/01/2021 demonstrating fibrosis and atypical cells  I personally reviewed the imaging of the renal mass biopsy and it appears to have very clearly penetrated the mass in question.  At this time  with a negative biopsy at the age of 30 I recommend surveillance with a CT scan in 6 months.    Our primary concern was given the patient's history of lung a thyroid cancer could this be a metastatic lesion and given the negative biopsy with very clear penetration of the mass on CT-guided biopsy this is less likely so.  Active surveillance with delayed intervention was also discussed with patient  as an option in patients who want to avoid surgery and are willing to accept an increased risk of tumor progression compared to either radical nephrectomy or partial nephrectomy.  Ideally, active surveillance is best utilized in a small renal mass (<4 cm) in a patient with high operative and anesthetic risks.      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 daily:    I have discussed in great detail with the patient the treatment of prostate enlargement/lower urinary tract symptoms using tamsulosin.  I have explained my rationale for using tamsulosin as well as potential risks of asthenia (2%), dizziness (5%), rhinitis (3%) and abnormal ejaculation (11%). I encouraged patient to report any prior or current history of alpha-1 antagonist use to their opthalmic surgeon before undergoing any eye surgery due to the risk of intraoperative floppy iris syndrome.  The patient expressed an understanding of the treatment, possible reactions, and possible prognosis.           Prostate cancer screening  Based on patient's clinical findings including his recent prostate specific antigen level, age, ethnicity and relevant risk factors and in accordance with the American Urological Association (AUA) & [place] (NCCN) published screening guidelines, I would recommend the following:  PSA today      Landis Gandy, DO     A combined total of 45 minutes were spent preparing to see the patient, reviewing previous records, ordering tests/medications/procedures, documenting the clinical encounter as well as performing a medically appropriate evaluation and independently interpreting results and communicating them to the patient/family/caregiver as specifically outlined above in the impression and plan.

## 2021-12-17 ENCOUNTER — Encounter (INDEPENDENT_AMBULATORY_CARE_PROVIDER_SITE_OTHER): Payer: Self-pay | Admitting: Student in an Organized Health Care Education/Training Program

## 2021-12-24 ENCOUNTER — Other Ambulatory Visit (HOSPITAL_COMMUNITY): Payer: Self-pay

## 2021-12-24 LAB — EXTERNAL COVID-19 MOLECULAR RESULT: External 2019-n-CoV/SARS-CoV-2: POSITIVE — AB

## 2022-06-04 ENCOUNTER — Ambulatory Visit (HOSPITAL_COMMUNITY): Payer: 59

## 2022-06-04 ENCOUNTER — Encounter (INDEPENDENT_AMBULATORY_CARE_PROVIDER_SITE_OTHER): Payer: Self-pay | Admitting: Student in an Organized Health Care Education/Training Program

## 2022-06-08 ENCOUNTER — Ambulatory Visit (HOSPITAL_COMMUNITY)
Admission: RE | Admit: 2022-06-08 | Discharge: 2022-06-08 | Disposition: A | Discharge status: Still In Hospital | Payer: 59 | Source: Ambulatory Visit

## 2022-06-08 ENCOUNTER — Encounter (INDEPENDENT_AMBULATORY_CARE_PROVIDER_SITE_OTHER): Payer: Self-pay | Admitting: Student in an Organized Health Care Education/Training Program

## 2022-06-08 ENCOUNTER — Other Ambulatory Visit: Payer: Self-pay

## 2022-06-08 ENCOUNTER — Other Ambulatory Visit (HOSPITAL_COMMUNITY): Payer: Self-pay | Admitting: Internal Medicine

## 2022-06-08 ENCOUNTER — Encounter (HOSPITAL_COMMUNITY): Payer: Self-pay

## 2022-06-08 DIAGNOSIS — M545 Low back pain, unspecified: Secondary | ICD-10-CM

## 2022-06-08 NOTE — PT Evaluation (Signed)
[facility]  Outpatient Physical Therapy  [place], [address]  [phone]  [phone]      Physical Therapy Lumbar Evaluation    Date: 06/08/2022  Patient's Name: Tanner [name]  Date of Birth: [birth year]  Physical Therapy Evaluation      PT diagnosis/Reason for Referral: low back pain and weakness             SUBJECTIVE    Date of onset: Pt reports 1 year hx of progressively worsening leg weakness. 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    Mechanism of injury: no MOI    Previous episodes/treatments: none    PMH: AAA, lung CA with partial lobe removal, CABG x2, ataxia, CAD, chronic obstructive lung disease    PLOF: able to walk community distances    Patient goals: REDUCE PAIN and NORMALIZE FUNCTION    Occupation:  retired    Next MD visit: 06/10/2022    Pain location: R low back                    Pain description: ACHING    Pain frequency:  INTERMITTENT    Pain rating: Now 0   Best 0   Worst 4    Radiculopathy: B LE weakness    Pain increases with: WALK           decreases with : SIT    Sensation: intact to LT    Weakness: B LE weakness    Sleep affected: no    Bowel/bladder problems:none reported    Subjective Functional Reports:    Sitting: WFL    Standing: LIMITED    Walking: LIMITED    Lifting: WFL          OBJECTIVE    Patient-Specific Functional Score:    Problem Score   1. Walking from his car to appt 0   2. Going up and down stairs 4   Total 2       AROM   right left   Flexion To floor    Extension Lacking 10 deg from neutral    Sidebend [facility]   Rotation [facility] WFL     ROM comments mild discomfort at end range lumbar ext; hip AROM WFL with tightness in B piriformis and HS    Strength     right left   Hip flexion (L1,2) 4 4   Hip extension (L5,S1,2) 4 4   Knee flexion (S1) 4 4   Knee extension (L2-4) 4 4   Ankle DF (L4-5) 4 4   Ankle PF (S1,2) 4 4   Core Uses UE for bed mobility --        Palpation: no TTP    Joint mobility Hypomobility with Pas L1-S1    Posture: DECREASED LORDOSIS, INCREASED THORACIC KYPHOSIS, and FORWARD HEAD    Gait: INITIATION OF GAIT WITH HESITATION, short strides B, QC in R hand, difficulty navigating obstacles and turning corners, CGA for gait in clinic, max gait 55 ft with QC and CGA at IE    Balance: RH EO 30 sec, RH EC 10 sec, SLS 5 sec B, RH on foam req UE support    4-item DGI: gait 1, gait with changing speed 1, gait with head turns H0 V0 TOTAL=2    Treatment provided:REVIEW OF POC AND GOALS WITH PATIENT, ALL QUESTIONS ANSWERED, PATIENT EDUCATION, and  THERAPEUTIC EXERCISE  (performed and reviewed HEP)    Access Code: [email or web address]  URL: [email or web address]  Date: 06/08/2022  Prepared by: Jeanella Flattery    Exercises  - Supine Double Knee to Chest Modified  - 1 x daily - 7 x weekly - 2 sets - 10 reps  - Hooklying Single Knee to Chest  - 1 x daily - 7 x weekly - 2 sets - 10 reps  - Supine Lower Trunk Rotation with PLB  - 1 x daily - 7 x weekly - 2 sets - 10 reps  - Seated Lumbar Flexion Stretch  - 1 x daily - 7 x weekly - 2 sets - 10 reps  - Sit to Stand Without Arm Support  - 1 x daily - 7 x weekly - 2 sets - 10 reps          ASSESSMENT    Impression: Pt presents with chronic B LE weakness accompanied by instability with static and dynamic balance and gait, difficulty walking household and community distances, and high fall risk. He does demo lumbar hypomobility consistent with stenosis and HEP was provided and reviewed for flexion based exercises. He will benefit from skilled PT services to optimize LE strength and endurance, to improve balance and gait, to improve lumbar mobility and decrease his risk for falls so he can safely participate in ADLs and recreational activities such as standing, walking, and going to appointments.     Rehab potential: GOOD      Short Term Goals: 4 Weeks   -pt will be able to navigate his home without AD and without falls   -Pt  will be be able to perform SLS for at least 10 sec B  -Pt will be able to walk 300' with SPV  - Pt will be able to perform STS from standard ht without AD.      Long Term Goals: 8 Weeks   -Pt will be able to walk at least 1000' with LRAD and no unsteadiness in his legs to improve community ambulation.    -Pt will be IND and compliant with HEP to improve carryover of gains mad with PT.              - Pt will improve B hip ext and abd MMT to at least 4+/5 to improve tolerance to standing and walking              - Pt will improve 4-Item DGI to at least 6 to reduce his risk for falls    PLAN  Patient will attend 2 times per week x 8 weeks. Therapy may include, but is not limited to THERAPEUTIC EXERCISES, MYOFASCIAL/JOINT MOBILIZATION, POSTURE/BODY MECHANICS, ERGONOMIC TRAINING, TRANSFER/GAIT TRAINING, HOME INSTRUCTIONS, HEAT/COLD, KINESIOTAPE, MECHANICAL TRACTION, and NEURO RE-EDUCATIOIN    Plan for next visit Initiate with NuStep, progress functional LE strength and endurance, gait training, review HEP       Evaluation complexity:   Personal factors impacting POC: EXTREME YOUTH OR ADVANCED AGE   Co-morbidities impacting POC: COPD/CHF and CARDIAC DISEASE  Complexity of physical exam: INCLUDING MUSCULOSKELETAL SYSTEM (POSTURE, ROM, STRENGTH, HEIGHT/WEIGHT), INCLUDING NEUROMUSCULAR EXAM (BALANCE, GAIT, LOCOMOTION, MOBILITY), and INCLUDING ACTIVITY/MOBILITY RESTRICTIONS   Clinical Presentation: STABLE   Evaluation Complexity: LOW-HISTORY 0, EXAMINATION 1-2, STABLE PRESENTATION        Total Session Time 45, Timed code minutes 15, and Untimed code minutes 30        Intervention minutes: EVALUATION 30 minutes and THERAPEUTIC EXERCISE 15 minutes  Jeanella Flattery, PT  06/08/2022, 10:57              Start of Service: _________          Certification:    From:______  Through:_________    I certify the need for these services furnished under this plan of treatment and while under my care.    Referring Provider Signature:  _______________     Date : _____________________    Printed name of Referring Provider: ___________________________________________

## 2022-06-10 ENCOUNTER — Other Ambulatory Visit: Payer: Self-pay

## 2022-06-10 ENCOUNTER — Ambulatory Visit (HOSPITAL_COMMUNITY)
Admission: RE | Admit: 2022-06-10 | Discharge: 2022-06-10 | Disposition: A | Discharge status: Still In Hospital | Payer: 59 | Source: Ambulatory Visit

## 2022-06-10 DIAGNOSIS — M545 Low back pain, unspecified: Secondary | ICD-10-CM

## 2022-06-10 NOTE — PT Treatment (Signed)
[facility]  Outpatient Physical Therapy  [place], [address]  [phone]  [phone]    Physical Therapy Treatment Note    Date: 06/10/2022  Patient's Name: Tanner [name]  Date of Birth: 1940/06/26  Physical Therapy Visit      Visit #/POC: 2/16  Authorization: y  POC Signed?: -  POC Ends: 08/03/2022  Next Progress Note Due: by visit 10      Evaluating Physical Therapist: Jeanella Flattery, PT, DPT  PT diagnosis/Reason for Referral: Low back pain and weakness  Next Scheduled Physician Appointment: 06/11/2022      Subjective: Pt reports no major change since IE. He still reports intense leg weakness on occasions but feels good when sitting.     Objective:     EXERCISE/ACTIVITY NAME REPETITIONS RESISTANCE COMPLETED THIS DOS   Walking warm up   5' CGA y   STS no UE   2 x 10 - y   Gastroc stretch/ seated HS stretch/ seated piriformis stretch   2'/2x2'/2x2' - y   4" step overs    X 10 ea II y   Rockerboard taps fw/bw and med/lat  Rockerboard balance fw/bw and med/lat   2'/2'  2'/2' II y   Marching gait   With walker, 2' - y   Tandem walking on foam (FW only)   3' - y                       Assessment: decreased coordination during tandem walking and balance activities noted more on R vs L LE. He has good tolerance to selective progression of gait, balance, and strengthening today. CGA for gait with 2WW. Gait quality deteriorates with fatigue.     Plan: continue progressing LE strength, coordination, balance, and safety    Total Session Time 42 and Timed code minutes 42  THERAPEUTIC EXERCISE 42 minutes      Jeanella Flattery, PT  06/10/2022, 18:07

## 2022-06-15 ENCOUNTER — Other Ambulatory Visit (HOSPITAL_COMMUNITY): Payer: Self-pay | Admitting: PSYCHIATRY AND NEUROLOGY-NEUROLOGY

## 2022-06-15 DIAGNOSIS — R269 Unspecified abnormalities of gait and mobility: Secondary | ICD-10-CM

## 2022-06-16 ENCOUNTER — Other Ambulatory Visit: Payer: Self-pay

## 2022-06-16 ENCOUNTER — Ambulatory Visit
Admission: RE | Admit: 2022-06-16 | Discharge: 2022-06-16 | Disposition: A | Discharge status: Home / Self Care | Payer: 59 | Source: Ambulatory Visit | Attending: Student in an Organized Health Care Education/Training Program | Admitting: Student in an Organized Health Care Education/Training Program

## 2022-06-16 ENCOUNTER — Ambulatory Visit (HOSPITAL_COMMUNITY)
Admission: RE | Admit: 2022-06-16 | Discharge: 2022-06-16 | Disposition: A | Discharge status: Still In Hospital | Payer: 59 | Source: Ambulatory Visit

## 2022-06-16 DIAGNOSIS — N2889 Other specified disorders of kidney and ureter: Secondary | ICD-10-CM | POA: Insufficient documentation

## 2022-06-16 MED ORDER — IOHEXOL 350 MG IODINE/ML INTRAVENOUS SOLUTION
100.0000 mL | INTRAVENOUS | Status: AC
Start: 2022-06-16 — End: 2022-06-16
  Administered 2022-06-16: 100 mL via INTRAVENOUS

## 2022-06-16 NOTE — PT Treatment (Signed)
[facility]  Outpatient Physical Therapy  [place], [address]  [phone]  [phone]    Physical Therapy Treatment Note    Date: 06/16/2022  Patient's Name: Tanner [name]  Date of Birth: 05-18-1940  Physical Therapy Visit      Visit #/POC: 3/16  Authorization: y  POC Signed?: -  POC Ends: 08/03/2022  Next Progress Note Due: by visit 10        Evaluating Physical Therapist: Jeanella Flattery, PT, DPT  PT diagnosis/Reason for Referral: Low back pain and weakness  Next Scheduled Physician Appointment: 06/11/2022        Subjective: Pt states that other people have told him they notice PT is helping because he is walking better.  States that he really has just noticed a slight improvement in his strength.      Objective: Treatment  delivered as follows: Arrived with quad cane.      EXERCISE/ACTIVITY NAME REPETITIONS RESISTANCE COMPLETED THIS DOS   Walking warm up    120 ft CGA y   STS no UE    2 x 10 - y   Gastroc stretch/ seated HS stretch/ seated piriformis stretch    2'/2x2'/2x2' - y   4" step overs     X 10 ea II y   Rockerboard taps fw/bw and med/lat  Rockerboard balance fw/bw and med/lat    2'/2'  2'/2' II n   Marching gait    With walker, 2' - y   Tandem walking on foam (FW only)    3' - n    NuStep     5 min    y    Step ups     x10 (B)  4 in  y   Pt ed amb. with RW   y         Assessment: Patient requires significant VC and visual cueing for safety with RW especially with tranfers and turns.  He needed VC with proper gait pattern. Patient tends to fatigue quickly. Educated patient that he needs to use RW not quad cane. He verbalized understanding.      Plan: continue progressing LE strength, coordination, balance, and safety             Total Session Time 38 and Timed code minutes 38  THERAPEUTIC EXERCISE 38 minutes      Carlton Adam, PTA  06/16/2022, 11:43

## 2022-06-18 ENCOUNTER — Ambulatory Visit (HOSPITAL_COMMUNITY)
Admission: RE | Admit: 2022-06-18 | Discharge: 2022-06-18 | Disposition: A | Discharge status: Still In Hospital | Payer: 59 | Source: Ambulatory Visit

## 2022-06-18 ENCOUNTER — Other Ambulatory Visit: Payer: Self-pay

## 2022-06-18 NOTE — PT Treatment (Signed)
[facility]  Outpatient Physical Therapy  [place], [address]  [phone]  [phone]    Physical Therapy Treatment Note    Date: 06/18/2022  Patient's Name: Tanner [name]  Date of Birth: [birth year]  Physical Therapy Visit    Visit #/POC: 4/16  Authorization: y  POC Signed?: -  POC Ends: 08/03/2022  Next Progress Note Due: by visit 10     Evaluating Physical Therapist: Jeanella Flattery, PT, DPT  PT diagnosis/Reason for Referral: Low back pain and weakness  Next Scheduled Physician Appointment: 06/11/2022        Subjective: Pt reports imrpovements in activity tolerance and he is walking better with his walker.     Objective: Treatment  delivered as follows: Arrived with quad cane.      EXERCISE/ACTIVITY NAME REPETITIONS RESISTANCE COMPLETED THIS DOS   Walking warm up    120 ft 2WW CGA y   STS no UE    2 x 10 - y   Gastroc stretch/ seated HS stretch/ seated piriformis stretch    2'/2x2'/2x2' - n   4" step overs     X 10 ea II y   Rockerboard taps fw/bw and med/lat  Rockerboard balance fw/bw and med/lat    2'/2'  2'/2' II n   Marching gait    With walker, 2' - y   Tandem walking on foam (FW only)   lateral walks 3'    5 laps in II -    red Y    y    NuStep     5 min    y    Step ups     x10 (B)  4 in  y   Gait with 2WW  220' CGA   y   Gait in II FW and BW x 5  y   rebounder 50x CGA y   HR/Toe raise 20x  II y         Assessment: Patient demos major improvements in gait distance and quality with 2WW. He demos continued improvements in LE strength and endurance, but R LE tends to drag during swing with fatigue. Mild festination noted with turning corners today.      Plan: continue progressing LE strength, coordination, balance, and safety    Total Session Time 42 and Timed code minutes 42  THERAPEUTIC EXERCISE 42 minutes      Jeanella Flattery, PT  06/18/2022, 11:44

## 2022-06-22 ENCOUNTER — Ambulatory Visit (HOSPITAL_COMMUNITY): Payer: Self-pay

## 2022-06-24 ENCOUNTER — Other Ambulatory Visit: Payer: Self-pay

## 2022-06-24 ENCOUNTER — Ambulatory Visit (HOSPITAL_COMMUNITY)
Admission: RE | Admit: 2022-06-24 | Discharge: 2022-06-24 | Disposition: A | Discharge status: Still In Hospital | Payer: 59 | Source: Ambulatory Visit

## 2022-06-24 NOTE — PT Treatment (Addendum)
[facility]  Outpatient Physical Therapy  [place], [address]  [phone]  [phone]    Physical Therapy Treatment Note    Date: 06/24/2022  Patient's Name: Tanner [name]  Date of Birth: 01-Aug-1940  Physical Therapy Visit    Visit #/POC: 5/16  Authorization: 5 of 15 Dates 05/21/22-10/06/22  POC Signed?: -  POC Ends: 08/03/2022  Next Progress Note Due: by visit 10     Evaluating Physical Therapist: Jeanella Flattery, PT, DPT  PT diagnosis/Reason for Referral: Low back pain and weakness  Next Scheduled Physician Appointment: 06/11/2022        Subjective: Pt reports it is getting easier to stand up. States he has to really focus so he doesn't drag his (R) leg when walking with walker. States he has the hardest time walking when he first gets OOB and late in the evening.      Objective: Treatment  delivered as follows: Arrived with quad cane.      EXERCISE/ACTIVITY NAME REPETITIONS RESISTANCE COMPLETED THIS DOS   Walking warm up    120 ft 2WW CGA y   STS no UE  Staggered sit to stand    2 x 10  X5 each 19 in  22 in Y  y   Gastroc stretch/ seated HS stretch/ seated piriformis stretch    2'/2x2'/2x2' - n   4" step overs     X 10 ea II y   Rockerboard taps fw/bw and med/lat  Rockerboard balance fw/bw and med/lat    2'/2'  2'/2' II n   Marching gait    With walker, 2' - y   Tandem walking on foam (FW only)   lateral walks 3'     5 laps in II -     Red above knees Y     y    NuStep     5 min  R3  y    Step ups     x10 (B)  4 in  y   Gait with 2WW  220' CGA   y   Gait in II FW and BW x 5   y   rebounder 50x CGA n   HR/Toe raise 20x  II y   Monster walks //bars  x4 RTB y         Assessment: Patient requires VC with safety and gait when ambulating with FWW. He was able to correct his (R) LE dragging and improved step length, but once fatigued he was unable to correct. Noted by the end of session he did have buckling of (R) knee.  Tolerated addition of staggered sit to  stand.      Plan: continue progressing LE strength, coordination, balance, and safety        Total Session Time 38 and Timed code minutes 38  THERAPEUTIC EXERCISE 38 minutes      Carlton Adam, PTA  06/24/2022, 13:15

## 2022-06-26 ENCOUNTER — Ambulatory Visit (HOSPITAL_COMMUNITY): Payer: 59

## 2022-06-29 ENCOUNTER — Other Ambulatory Visit: Payer: Self-pay

## 2022-06-29 ENCOUNTER — Ambulatory Visit (INDEPENDENT_AMBULATORY_CARE_PROVIDER_SITE_OTHER): Payer: 59 | Admitting: Student in an Organized Health Care Education/Training Program

## 2022-06-29 ENCOUNTER — Encounter (INDEPENDENT_AMBULATORY_CARE_PROVIDER_SITE_OTHER): Payer: Self-pay | Admitting: Student in an Organized Health Care Education/Training Program

## 2022-06-29 VITALS — BP 129/57 | HR 72 | Ht 72.0 in | Wt 208.0 lb

## 2022-06-29 DIAGNOSIS — N400 Enlarged prostate without lower urinary tract symptoms: Secondary | ICD-10-CM

## 2022-06-29 DIAGNOSIS — N401 Enlarged prostate with lower urinary tract symptoms: Secondary | ICD-10-CM

## 2022-06-29 DIAGNOSIS — N2889 Other specified disorders of kidney and ureter: Secondary | ICD-10-CM

## 2022-06-29 NOTE — Progress Notes (Signed)
UROLOGY, [facility]  [address] [address]    Progress Note    Name: Tanner [name] MRN:  [id number]   Date: 06/29/2022 DOB:  1940-04-05 (82 y.o.)           Chief Complaint: Follow Up (F/u on CT 06-16-22,   BPH/LUTS,//PSA  2.41   12-04-21)    Subjective:   Tanner [name] is a pleasant 82 year old man who presents with a recent abnormal MRI demonstrating a left enhancing renal mass measuring 3 cm.  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  He recently underwent a renal mass biopsy which demonstrated fibrosis and rare atypical cells.      He underwent a follow-up CT scan renal mass protocol on June 16, 2022.  It demonstrated a slight decrease in size of the mass with minimal enhancement. He denies fevers, chills, nausea, vomiting, hematuria, dysuria, flank pain, incontinence, dribbling, hesitancy, suprapubic pain, headaches, vision changes, shortness of breath, chest pain.        Objective :  BP (!) 129/57 (Site: Right, Patient Position: Sitting, Cuff Size: Adult)   Pulse 72   Ht 1.829 m (6')   Wt 94.3 kg (208 lb)   BMI 28.21 kg/m       Gen: NAD, alert  Pulm: unlabored at rest  CV: palpable pulses  Abd: soft, Nt/ND  GU: no suprapubic tenderness, no CVAT      Data reviewed:    Current Outpatient Medications   Medication Sig    albuterol sulfate (PROVENTIL OR VENTOLIN OR PROAIR) 90 mcg/actuation Inhalation oral inhaler Take 2 Puffs by inhalation Once a day    aspirin 81 mg Oral Tablet, Chewable Chew 1 Tablet (81 mg total) Once a day    atorvastatin (LIPITOR) 80 mg Oral Tablet Take 1 Tablet (80 mg total) by mouth Every night    bromocriptine (PARLODEL) 2.5 mg Oral Tablet Take 1 Tablet (2.5 mg total) by mouth Once a day    calcium carbonate-vitamin D3 600 mg-10 mcg (400 unit) Oral Tablet Take 2 Tablets by mouth Once a day    diphenhydrAMINE (BENADRYL) 50 mg Oral Capsule Take 1 Capsule (50 mg total) by  mouth Every night    fluticasone propion-salmeteroL (ADVAIR) 500-50 mcg/dose Inhalation oral diskus inhaler Take 1 Puff (1 INHALATION  total) by inhalation Twice daily    ibuprofen (MOTRIN) 400 mg Oral Tablet Take 1 Tablet (400 mg total) by mouth Twice per day as needed    ketotifen fumarate (EYE ITCH RELIEF) 0.025 % (0.035 %) Ophthalmic Drops Instill 1 Drop into both eyes Twice daily    levothyroxine (SYNTHROID) 137 mcg Oral Tablet Take 1 Tablet (137 mcg total) by mouth Once a day    losartan (COZAAR) 50 mg Oral Tablet Take 0.5 Tablets (25 mg total) by mouth Once a day    metoprolol succinate (TOPROL-XL) 25 mg Oral Tablet Sustained Release 24 hr Take 1 Tablet (25 mg total) by mouth Once a day    omeprazole (PRILOSEC) 20 mg Oral Capsule, Delayed Release(E.C.) Take 1 Capsule (20 mg total) by mouth Once a day    polyethylene glycol (MIRALAX) 17 gram/dose Oral Powder TAKE 1 CAPFUL (17GM) BY MOUTH EVERY DAY (MIX POWDER IN 4 TO 8 OUNCES OF FLUID)    tamsulosin (FLOMAX) 0.4 mg Oral Capsule Take 1 Capsule (0.4 mg total) by mouth Every evening after dinner  torsemide (DEMADEX) 20 mg Oral Tablet Take 0.5 Tablets (10 mg total) by mouth Once a day     Assessment/Plan  Problem List Items Addressed This Visit    None      Left enhancing renal mass, 3.1cm, with history of lung and thyroid cancer s/p renal mass biopsy on 12/01/2021 demonstrating fibrosis and atypical cells  I personally reviewed the imaging of the renal mass biopsy and it appears to have very clearly penetrated the mass in question.  At this time with a negative biopsy at the age of 71 I recommend surveillance with a CT scan in 6 months.    Our primary concern was given the patient's history of lung a thyroid cancer could this be a metastatic lesion and given the negative biopsy with very clear penetration of the mass on CT-guided biopsy this is less likely so.  Active surveillance with delayed intervention was also discussed with patient  as an option in patients  who want to avoid surgery and are willing to accept an increased risk of tumor progression compared to either radical nephrectomy or partial nephrectomy.  Ideally, active surveillance is best utilized in a small renal mass (<4 cm) in a patient with high operative and anesthetic risks.  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 daily:    I have discussed in great detail with the patient the treatment of prostate enlargement/lower urinary tract symptoms using tamsulosin.  I have explained my rationale for using tamsulosin as well as potential risks of asthenia (2%), dizziness (5%), rhinitis (3%) and abnormal ejaculation (11%). I encouraged patient to report any prior or current history of alpha-1 antagonist use to their opthalmic surgeon before undergoing any eye surgery due to the risk of intraoperative floppy iris syndrome.  The patient expressed an understanding of the treatment, possible reactions, and possible prognosis.           Prostate cancer screening  Based on patient's clinical findings including his recent prostate specific antigen level, age, ethnicity and relevant risk factors and in accordance with the American Urological Association (AUA) & National Comprehensive Cancer Network (NCCN) published screening guidelines, I would recommend the following:  PSA in one year      Tanner Hove, DO     A combined total of 45 minutes were spent preparing to see the patient, reviewing previous records, ordering tests/medications/procedures, documenting the clinical encounter as well as performing a medically appropriate evaluation and independently interpreting results and  communicating them to the patient/family/caregiver as specifically outlined above in the impression and plan.    This note may have been fully or partially generated using MModal Fluency Direct system, and there may be some incorrect words, spellings, and punctuation that were not identified in checking the note before saving.

## 2022-06-30 ENCOUNTER — Ambulatory Visit (HOSPITAL_COMMUNITY)
Admission: RE | Admit: 2022-06-30 | Discharge: 2022-06-30 | Disposition: A | Discharge status: Still In Hospital | Payer: 59 | Source: Ambulatory Visit

## 2022-06-30 NOTE — PT Treatment (Addendum)
[facility]  Outpatient Physical Therapy  [place], [address]  [phone]  [phone]    Physical Therapy Treatment Note    Date: 06/30/2022  Patient's Name: Tanner [name]  Date of Birth: Nov 17, 1940  Physical Therapy Visit      Visit #/POC: 6/16  Authorization: 6 of 15 Dates 05/21/22-10/06/22  POC Signed?: NO  POC Ends: 08/03/2022  Next Progress Note Due: by visit 10     Evaluating Physical Therapist: Jeanella Flattery, PT, DPT  PT diagnosis/Reason for Referral: Low back pain and weakness  Next Scheduled Physician Appointment: 06/11/2022        Subjective: Pt reports it is getting easier to stand up. States  she feels his legs are still weak.      Objective: Treatment  delivered as follows: Arrived with FWW     EXERCISE/ACTIVITY NAME REPETITIONS RESISTANCE COMPLETED THIS DOS   Walking warm up    120 ft 2WWx2 before and after treatment CGA y   STS no UE  Staggered sit to stand    2 x 10  X5 each 19 in  22 in Y  y   Gastroc stretch/ seated HS stretch/ seated piriformis stretch    2'/2x2'/2x2' - n   4" step overs     X 10 ea //bars y   Rockerboard taps fw/bw and med/lat  Rockerboard balance fw/bw and med/lat    2'/2'  2'/2' // bars n   Marching gait   //bars  // bars y   Tandem walking on foam (FW only)   lateral walks 3'     5 laps in II -     Red above knees Y     y    NuStep     5 min  R5  y    Step ups     x10 (B)  4 in  y   Gait with 2WW  220' CGA   y   Gait in II FW and BW x 5   y   rebounder 50x CGA n   HR/Toe raise 20x  //  bars y   [employer] walks //bars  x4 RTB y   Standing hip abb x10 RTB y         Assessment: Patient continues to require VC with safety esp with turns. Continues to drag his feet when ambulating but can correct with with VC for approximately 10 feet. He did require 1 seated break  when ambulating after treatment.      Short Term Goals: 4 Weeks              -pt will be able to navigate his home without AD and without falls              -Pt  will be be able to perform SLS for at least 10 sec B  -Pt will be able to walk 300' with SPV  - Pt will be able to perform STS from standard ht without AD.        Long Term Goals: 8 Weeks              -Pt will be able to walk at least 1000' with LRAD and no unsteadiness in his legs to improve community ambulation.               -Pt will be IND and compliant with HEP to improve carryover of gains mad with PT.              -  Pt will improve B hip ext and abd MMT to at least 4+/5 to improve tolerance to standing and walking              - Pt will improve 4-Item DGI to at least 6 to reduce his risk for falls     Plan: continue progressing LE strength, coordination, balance, and safety.              Total Session Time 41 and Timed code minutes 41  THERAPEUTIC EXERCISE 41 minutes      Carlton Adam, PTA  06/30/2022, 13:26

## 2022-07-02 ENCOUNTER — Other Ambulatory Visit: Payer: Self-pay

## 2022-07-02 ENCOUNTER — Ambulatory Visit (HOSPITAL_COMMUNITY)
Admission: RE | Admit: 2022-07-02 | Discharge: 2022-07-02 | Disposition: A | Discharge status: Still In Hospital | Payer: 59 | Source: Ambulatory Visit

## 2022-07-02 NOTE — PT Treatment (Signed)
[facility] Medicine [facility]  Outpatient Physical Therapy  [address]  [place], [address]  [phone]  (Fax) [phone]    Physical Therapy Treatment Note    Date: 07/02/2022  Patient's Name: Tanner [name]  Date of Birth: 03/01/1941  Physical Therapy Visit      Visit #/POC: 7/16  Authorization: 7 of 15 Dates 05/21/22-10/06/22  POC Signed?: NO  POC Ends: 08/03/2022  Next Progress Note Due: by visit 10     Evaluating Physical Therapist: Shirlean Schlein, PT, DPT  PT diagnosis/Reason for Referral: Low back pain and weakness  Next Scheduled Physician Appointment: 06/11/2022        Subjective: No new subjective report today.    Objective: Treatment as noted below:     EXERCISE/ACTIVITY NAME REPETITIONS RESISTANCE COMPLETED THIS DOS   Walking warm up    120 ft 2WWx2 before and after treatment CGA y   STS no UE  Staggered sit to stand    2 x 10  X5 each 19 in  22 in Y  y   Gastroc stretch/ seated HS stretch/ seated piriformis stretch    2'/2x2'/2x2' - n   4" step overs     X 10 ea //bars N    Rockerboard taps fw/bw and med/lat  Rockerboard balance fw/bw and med/lat    2'/2'  2'/2' // bars n   Marching gait   //bars  // bars y   Tandem walking on foam (FW only)   lateral walks 3'     5 laps in II -     Red above knees Y     y    NuStep    6 min  R5  y    Step ups     x10 (B)  6 in  y   Gait with 2WW  220' CGA   y   Gait in II FW and BW x 5   y   rebounder 50x CGA n   HR/Toe raise 20x  //  bars y   [employer] walks //bars  x4 RTB n   Standing hip abb x10 RTB n         Assessment: Toward end of session, likely due to increased fatigue, patient's gait pattern was worsened. He needed heavy cues for gait pattern and foot clearance. Otherwise, tolerated session well.      Short Term Goals: 4 Weeks              -pt will be able to navigate his home without AD and without falls              -Pt will be be able to perform SLS for at least 10 sec B  -Pt will be able to walk 300' with SPV  - Pt will be able to  perform STS from standard ht without AD.        Long Term Goals: 8 Weeks              -Pt will be able to walk at least 1000' with LRAD and no unsteadiness in his legs to improve community ambulation.               -Pt will be IND and compliant with HEP to improve carryover of gains mad with PT.              - Pt will improve B hip ext and abd MMT to at least 4+/5 to improve tolerance  to standing and walking              - Pt will improve 4-Item DGI to at least 6 to reduce his risk for falls     Plan: continue progressing LE strength, coordination, balance, and safety.        Total Session Time 37 and Timed code minutes 37  THERAPEUTIC EXERCISE 37 minutes      Sharman Garrott, PTA  07/02/2022 13:23

## 2022-07-06 ENCOUNTER — Ambulatory Visit
Admission: RE | Admit: 2022-07-06 | Discharge: 2022-07-06 | Disposition: A | Discharge status: Still In Hospital | Payer: 59 | Source: Ambulatory Visit | Attending: Internal Medicine | Admitting: Internal Medicine

## 2022-07-06 ENCOUNTER — Other Ambulatory Visit: Payer: Self-pay

## 2022-07-06 NOTE — Progress Notes (Signed)
[name], [address]  [phone]  (Fax) [phone]    Physical Therapy Progress Note    Date: 07/06/2022  Patient's Name: Tanner [name]  Date of Birth: Feb 24, 1941  Physical Therapy Progress Note       [facility]  Outpatient Physical Therapy  [address]  [place], [address]  [phone]  (Fax) [phone]     Physical Therapy Treatment Note     Date: 07/02/2022  Patient's Name: Tanner [name]  Date of Birth: 1940/06/25  Physical Therapy Visit       Visit #/POC: 8/16  Authorization: 8 of 15 Dates 05/21/22-10/06/22  POC Signed?: NO  POC Ends: 08/03/2022  Next Progress Note Due: visit 15-16     Evaluating Physical Therapist: Shirlean Schlein, PT, DPT  PT diagnosis/Reason for Referral: Low back pain and weakness     Subjective: Pt is compliant with HEP, but his legs have felt very weak for the past 2 days. Overall he reports minimal subjective improvement and his wife concurs.      Patient-Specific Functional Score:     Problem Score   1. Walking from his car to appt 3   2. Going up and down stairs 5   Total 4       Objective:     Strength       right left   Hip flexion (L1,2) 4 4   Hip extension (L5,S1,2) 4+ 4   Knee flexion (S1) 4 4   Knee extension (L2-4) 4+ 4   Ankle DF (L4-5) 4+ 4+   Ankle PF (S1,2) 4+ 4+   Core Uses UE for bed mobility --     Gait: INITIATION OF GAIT WITH HESITATION, short strides B, QC in R hand, difficulty navigating obstacles and turning corners, CGA for gait in clinic, max gait 55 ft with QC and CGA at IE     Balance: RH EO 30 sec, RH EC 10 sec, SLS 5 sec B, RH on foam req UE support     4-item DGI: gait 2, gait with changing speed 1, gait with head turns H1 V1 TOTAL=5  Treatment as noted below:     EXERCISE/ACTIVITY NAME REPETITIONS RESISTANCE COMPLETED THIS DOS   Walking     200 ft 2WWx2 before and after treatment CGA y   STS no UE  Staggered sit to stand     2 x 10  X5 each 19 in  22 in Y  y   Gastroc stretch/ seated HS stretch/ seated piriformis stretch    2'/2x2'/2x2' - n   4" step overs     X 10 ea //bars N    Rockerboard taps fw/bw and med/lat  Rockerboard balance fw/bw and med/lat    2'/2'  2'/2' // bars n   Marching gait   //bars   // bars y   Tandem walking on foam (FW only)   lateral walks 3'     5 laps in II -     Red above knees Y     y    NuStep    6 min  R5  y    Step ups     x10 (B)  6 in  y   Gait with 2WW 300' CGA, 300'CGA, 500' CGA   y   Gait in II FW and BW x 5   y   rebounder  50x CGA n   HR/Toe raise 20x  //  bars y   [employer] walks //bars  x5 RTB y   Standing hip abb x10 RTB y             Assessment: Pt demos festinating gait with direction changes and with fatigue today. He was able to progress total gait distance as well as reduce assistance from CGA to SBA for entering and exiting clinic. He is making fair progress towards all goals. Plan to continue progressing LE strength and balance for 4 more weeks.      Short Term Goals: 4 Weeks              -pt will be able to navigate his home without AD and without falls (MET 07/06/2022)              -Pt will be be able to perform SLS for at least 10 sec B (PROGRESSING 3 sec 07/06/2022)  -Pt will be able to walk 300' with SPV (MET 07/06/2022)  - Pt will be able to perform STS from standard ht without AD. (MET 07/06/2022)        Long Term Goals: 8 Weeks              -Pt will be able to walk at least 1000' with LRAD and no unsteadiness in his legs to improve community ambulation. (Progressing with rest breaks 07/06/2022)              -Pt will be IND and compliant with HEP to improve carryover of gains made with PT. (MET 07/06/2022)              - Pt will improve B hip ext and abd MMT to at least 4+/5 to improve tolerance to standing and walking (PROGRESSING 07/06/2022)              - Pt will improve 4-Item DGI to at least 6 to reduce his risk for falls (PROGRESSING 07/06/2022)     Plan: continue progressing LE strength,  coordination, balance, and safety.          Total Session Time 45 and Timed code minutes 45  THERAPEUTIC EXERCISE 25 minutes and GAIT TRAINING [address]      Shirlean Schlein, PT  07/06/2022, 14:11

## 2022-07-08 ENCOUNTER — Ambulatory Visit (HOSPITAL_COMMUNITY)
Admission: RE | Admit: 2022-07-08 | Discharge: 2022-07-08 | Disposition: A | Discharge status: Still In Hospital | Payer: 59 | Source: Ambulatory Visit

## 2022-07-08 ENCOUNTER — Other Ambulatory Visit: Payer: Self-pay

## 2022-07-08 DIAGNOSIS — M545 Low back pain, unspecified: Secondary | ICD-10-CM | POA: Insufficient documentation

## 2022-07-08 NOTE — PT Treatment (Signed)
[facility] Medicine [facility]  Outpatient Physical Therapy  [address]  [place], [address]  [phone]  (Fax) [phone]    Physical Therapy Treatment Note    Date: 07/08/2022  Patient's Name: Tanner [name]  Date of Birth: 17-Feb-1941  Physical Therapy Visit      Visit #/POC: 8/16  Authorization: 8 of 15 Dates 05/21/22-10/06/22  POC Signed?: NO  POC Ends: 08/03/2022  Next Progress Note Due:5/9 or visit 15-16     Evaluating Physical Therapist: Shirlean Schlein, PT, DPT  PT diagnosis/Reason for Referral: Low back pain and weakness     Subjective: Pt states he has days where he feels like his strength is better but he also has days where he feels very weak.      Patient-Specific Functional Score:     Problem Score   1. Walking from his car to appt 3   2. Going up and down stairs 5   Total 4         Objective: Treatment delivered as follows:     Strength 4/9       right left   Hip flexion (L1,2) 4 4   Hip extension (L5,S1,2) 4+ 4   Knee flexion (S1) 4 4   Knee extension (L2-4) 4+ 4   Ankle DF (L4-5) 4+ 4+   Ankle PF (S1,2) 4+ 4+   Core Uses UE for bed mobility --     07/06/22   Gait: INITIATION OF GAIT WITH HESITATION, short strides B, QC in R hand, difficulty navigating obstacles and turning corners, CGA for gait in clinic, max gait 55 ft with QC and CGA at IE     Balance: RH EO 30 sec, RH EC 10 sec, SLS 5 sec B, RH on foam req UE support     4-item DGI: gait 2, gait with changing speed 1, gait with head turns H1 V1 TOTAL=5  Treatment as noted below:     EXERCISE/ACTIVITY NAME REPETITIONS RESISTANCE COMPLETED THIS DOS   Walking     200 ft 2WWx2 before and after treatment CGA y   STS no UE  Staggered sit to stand    2 x 10  X10 each 18 in  22 in Y  y   Gastroc stretch/ seated HS stretch/ seated piriformis stretch    2'/2x2'/2x2' - n   4" step overs     X 10 ea //bars N    Rockerboard taps fw/bw and med/lat  Rockerboard balance fw/bw and med/lat    2'/2'  2'/2' // bars n   Marching gait   //bars    // bars y   Tandem walking on foam (FW only)   lateral walks 3'     5 laps in II -     Red above knees Y     y    NuStep    6 min  R5  y    Step ups     x10 (B)  6 in  y   Gait with 2WW 300' CGA, 300'CGA, 500' CGA   Y  n  n   Gait in II FW and BW x 5   y   rebounder 50x CGA n   HR/Toe raise 20x  //  bars y   Public libr[employer] walks //bars  x5 RTB y   Standing hip abb x10 RTB y               Assessment: Patient continued to demonstrate  festinating gait with direction changes, however, this did improve when I cued the patient with an audibile beat. Noted he did perform tranfers with less difficulty.      Short Term Goals: 4 Weeks              -pt will be able to navigate his home without AD and without falls (MET 07/06/2022)              -Pt will be be able to perform SLS for at least 10 sec B (PROGRESSING 3 sec 07/06/2022)  -Pt will be able to walk 300' with SPV (MET 07/06/2022)  - Pt will be able to perform STS from standard ht without AD. (MET 07/06/2022)        Long Term Goals: 8 Weeks              -Pt will be able to walk at least 1000' with LRAD and no unsteadiness in his legs to improve community ambulation. (Progressing with rest breaks 07/06/2022)              -Pt will be IND and compliant with HEP to improve carryover of gains made with PT. (MET 07/06/2022)              - Pt will improve B hip ext and abd MMT to at least 4+/5 to improve tolerance to standing and walking (PROGRESSING 07/06/2022)              - Pt will improve 4-Item DGI to at least 6 to reduce his risk for falls (PROGRESSING 07/06/2022)     Plan: continue progressing LE strength, coordination, balance, and safety.           Total Session Time 40 and Timed code minutes 40  THERAPEUTIC EXERCISE 32 minutes and GAIT TRAINING 8 MINUTES      Trilby Leaver, PTA  07/08/2022, 13:10

## 2022-07-13 ENCOUNTER — Other Ambulatory Visit: Payer: Self-pay

## 2022-07-13 ENCOUNTER — Ambulatory Visit (HOSPITAL_COMMUNITY)
Admission: RE | Admit: 2022-07-13 | Discharge: 2022-07-13 | Disposition: A | Discharge status: Still In Hospital | Payer: 59 | Source: Ambulatory Visit

## 2022-07-13 NOTE — PT Treatment (Addendum)
[facility] Medicine [facility]  Outpatient Physical Therapy  [address]  [place], [address]  ([phone]  (Fax) [phone]    Physical Therapy Treatment Note    Date: 07/13/2022  Patient's Name: Tanner [name]  Date of Birth: Sep 01, 1940  Physical Therapy Visit      Visit #/POC: 10/16  Authorization: 10 of 15 Dates 05/21/22-10/06/22  POC Signed?: NO  POC Ends: 08/03/2022  Next Progress Note Due:5/9 or visit 15-16     Evaluating Physical Therapist: Shirlean Schlein, PT, DPT  PT diagnosis/Reason for Referral: Low back pain and weakness     Subjective: Pt states the day after he does his HEP he is very weak the next day. States that he only does 12 reps but he does do a squat as far done as he can..      Patient-Specific Functional Score:     Problem Score   1. Walking from his car to appt 3   2. Going up and down stairs 5   Total 4         Objective: Treatment delivered as follows:     Strength 4/9       right left   Hip flexion (L1,2) 4 4   Hip extension (L5,S1,2) 4+ 4   Knee flexion (S1) 4 4   Knee extension (L2-4) 4+ 4   Ankle DF (L4-5) 4+ 4+   Ankle PF (S1,2) 4+ 4+   Core Uses UE for bed mobility --      07/06/22   Gait: INITIATION OF GAIT WITH HESITATION, short strides B, QC in R hand, difficulty navigating obstacles and turning corners, CGA for gait in clinic, max gait 55 ft with QC and CGA at IE     Balance: RH EO 30 sec, RH EC 10 sec, SLS 5 sec B, RH on foam req UE support     4-item DGI: gait 2, gait with changing speed 1, gait with head turns H1 V1 TOTAL=5  Treatment as noted below:     EXERCISE/ACTIVITY NAME REPETITIONS RESISTANCE COMPLETED THIS DOS   Walking     200 ft x3 FWW before and after treatment CGA y   STS no UE  Staggered sit to stand    2 x 10  X10 each 18 in  22 in Y  y   Gastroc stretch/ seated HS stretch/ seated piriformis stretch    2'/2x2'/2x2' - n   4" step overs     X 10 ea //bars N    Rockerboard taps fw/bw and med/lat  Rockerboard balance fw/bw and med/lat     2'/2'  2'/2' // bars n   Marching gait   //bars   // bars y   Tandem walking on foam (FW only)   lateral walks 3'     5 laps in II -     Red above knees Y     y    NuStep    6 min  R5  y    Step ups     x10 (B)  6 in  y   Gait with 2WW 300' CGA, 300'CGA, 500' CGA   Y  n  n   Gait in II FW and BW x 5   y   rebounder 50x CGA n   HR/Toe raise 20x  //  bars y   [employer] walks //bars  x5 RTB y   Standing hip abd x10 RTB n   Leg press  2x10 60# y               Assessment: Patient continued to demonstrate  festinating gait with direction changes. He continues to tend to leave walker when he performs turns to sit down. Tolerated addition of leg press with no adverse effects.      Short Term Goals: 4 Weeks              -pt will be able to navigate his home without AD and without falls (MET 07/06/2022)              -Pt will be be able to perform SLS for at least 10 sec B (PROGRESSING 3 sec 07/06/2022)  -Pt will be able to walk 300' with SPV (MET 07/06/2022)  - Pt will be able to perform STS from standard ht without AD. (MET 07/06/2022)        Long Term Goals: 8 Weeks              -Pt will be able to walk at least 1000' with LRAD and no unsteadiness in his legs to improve community ambulation. (Progressing with rest breaks 07/06/2022)              -Pt will be IND and compliant with HEP to improve carryover of gains made with PT. (MET 07/06/2022)              - Pt will improve B hip ext and abd MMT to at least 4+/5 to improve tolerance to standing and walking (PROGRESSING 07/06/2022)              - Pt will improve 4-Item DGI to at least 6 to reduce his risk for falls (PROGRESSING 07/06/2022)     Plan: continue progressing LE strength, coordination, balance, and safety.                   Total Session Time 45 and Timed code minutes 45  THERAPEUTIC EXERCISE 37 minutes and GAIT TRAINING 8 MINUTES      Trilby Leaver, PTA  07/13/2022, 13:58

## 2022-07-15 ENCOUNTER — Other Ambulatory Visit: Payer: Self-pay

## 2022-07-15 ENCOUNTER — Ambulatory Visit (HOSPITAL_COMMUNITY)
Admission: RE | Admit: 2022-07-15 | Discharge: 2022-07-15 | Disposition: A | Discharge status: Still In Hospital | Payer: 59 | Source: Ambulatory Visit

## 2022-07-15 NOTE — PT Treatment (Signed)
[facility] Medicine [facility]  Outpatient Physical Therapy  [address]  [place], [address]  [phone]  (Fax) [phone]    Physical Therapy Treatment Note    Date: 07/15/2022  Patient's Name: Tanner [name]  Date of Birth: Nov 03, 1940  Physical Therapy Visit        Visit #/POC: 11/16  Authorization: 11 of 15 Dates 05/21/22-10/06/22  POC Signed?: NO  POC Ends: 08/03/2022  Next Progress Note Due:5/9 or visit 15-16     Evaluating Physical Therapist: Shirlean Schlein, PT, DPT  PT diagnosis/Reason for Referral: Low back pain and weakness     Subjective: Pt states he is tired and not walking very good today.     Patient-Specific Functional Score:     Problem Score   1. Walking from his car to appt 3   2. Going up and down stairs 5   Total 4         Objective: Treatment delivered as follows:     Strength 4/9       right left   Hip flexion (L1,2) 4 4   Hip extension (L5,S1,2) 4+ 4   Knee flexion (S1) 4 4   Knee extension (L2-4) 4+ 4   Ankle DF (L4-5) 4+ 4+   Ankle PF (S1,2) 4+ 4+   Core Uses UE for bed mobility --      07/06/22   Gait: INITIATION OF GAIT WITH HESITATION, short strides B, QC in R hand, difficulty navigating obstacles and turning corners, CGA for gait in clinic, max gait 55 ft with QC and CGA at IE     Balance: RH EO 30 sec, RH EC 10 sec, SLS 5 sec B, RH on foam req UE support     4-item DGI: gait 2, gait with changing speed 1, gait with head turns H1 V1 TOTAL=5  Treatment as noted below:     EXERCISE/ACTIVITY NAME REPETITIONS RESISTANCE COMPLETED THIS DOS   Walking     200 ft x3 FWW before and after treatment CGA y   STS no UE  Staggered sit to stand    2 x 10  X10 each 18 in  22 in Y  y   Gastroc stretch/ seated HS stretch/ seated piriformis stretch    2'/2x2'/2x2' - n   4" step overs     X 10 ea //bars N    Rockerboard taps fw/bw and med/lat  Rockerboard balance fw/bw and med/lat    2'/2'  2'/2' // bars n   Marching gait   //bars   // bars y   Tandem walking on foam (FW only)    lateral walks 3'     5 laps in II -     Red above knees Y     y    NuStep    8  min  R5  y    Step ups     x10 (B)  6 in  y   Gait with 2WW 300' CGA, 300'CGA, 500' CGA   Y  n  n   Gait in II FW and BW x 5   y   rebounder 50x CGA n   HR/Toe raise 20x  //  bars y   [employer] walks //bars  x5 RTB n   Standing hip abd x10 RTB y   Leg press  2x10 60# y               Assessment: Festinating gait with direction  changes. Also requires significant VC with safety (tends to leave walker). Patent fatigued quickly today and required several rest breaks.     Short Term Goals: 4 Weeks              -pt will be able to navigate his home without AD and without falls (MET 07/06/2022)              -Pt will be be able to perform SLS for at least 10 sec B (PROGRESSING 3 sec 07/06/2022)  -Pt will be able to walk 300' with SPV (MET 07/06/2022)  - Pt will be able to perform STS from standard ht without AD. (MET 07/06/2022)        Long Term Goals: 8 Weeks              -Pt will be able to walk at least 1000' with LRAD and no unsteadiness in his legs to improve community ambulation. (Progressing with rest breaks 07/06/2022)              -Pt will be IND and compliant with HEP to improve carryover of gains made with PT. (MET 07/06/2022)              - Pt will improve B hip ext and abd MMT t38o at least 4+/5 to improve tolerance to standing and walking (PROGRESSING 07/06/2022)              - Pt will improve 4-Item DGI to at least 6 to reduce his risk for falls (PROGRESSING 07/06/2022)     Plan: continue progressing LE strength, coordination, balance, and safety.                 Total Session Time 38 and Timed code minutes 38  THERAPEUTIC EXERCISE 38 minutes      Trilby Leaver, PTA  07/15/2022, 13:59

## 2022-07-20 ENCOUNTER — Ambulatory Visit (HOSPITAL_COMMUNITY)
Admission: RE | Admit: 2022-07-20 | Discharge: 2022-07-20 | Disposition: A | Discharge status: Still In Hospital | Payer: 59 | Source: Ambulatory Visit

## 2022-07-20 ENCOUNTER — Other Ambulatory Visit: Payer: Self-pay

## 2022-07-20 NOTE — PT Treatment (Signed)
[facility] Medicine [facility]  Outpatient Physical Therapy  [address]  [place], [address]  [phone]  (Fax) [phone]    Physical Therapy Treatment Note    Date: 07/20/2022  Patient's Name: Tanner [name]  Date of Birth: 1940-12-30  Physical Therapy Visit        Visit #/POC: 12/16  Authorization: 12 of 15 Dates 05/21/22-10/06/22  POC Signed?: Yes  POC Ends: 08/03/2022  Next Progress Note Due:5/9 or visit 15-16     Evaluating Physical Therapist: Shirlean Schlein, PT, DPT  PT diagnosis/Reason for Referral: Low back pain and weakness     Subjective: Pt states he has not fallen in a while. States he can't really walk a lot at home because he doesn't have a good place to walk.     Patient-Specific Functional Score:     Problem Score   1. Walking from his car to appt 3   2. Going up and down stairs 5   Total 4         Objective: Treatment delivered as follows:     Strength 4/9       right left   Hip flexion (L1,2) 4 4   Hip extension (L5,S1,2) 4+ 4   Knee flexion (S1) 4 4   Knee extension (L2-4) 4+ 4   Ankle DF (L4-5) 4+ 4+   Ankle PF (S1,2) 4+ 4+   Core Uses UE for bed mobility --      07/06/22   Gait: INITIATION OF GAIT WITH HESITATION, short strides B, QC in R hand, difficulty navigating obstacles and turning corners, CGA for gait in clinic, max gait 55 ft with QC and CGA at IE     Balance: RH EO 30 sec, RH EC 10 sec, SLS 5 sec B, RH on foam req UE support     4-item DGI: gait 2, gait with changing speed 1, gait with head turns H1 V1 TOTAL=5  Treatment as noted below:     EXERCISE/ACTIVITY NAME REPETITIONS RESISTANCE COMPLETED THIS DOS   Walking     200 ft x3 FWW before and after treatment CGA y   STS no UE  Staggered sit to stand    2 x 10  X10 each 18 in  22 in Y  y   Gastroc stretch/ seated HS stretch/ seated piriformis stretch    2'/2x2'/2x2' - n   4" step overs     X 10 ea //bars N    Rockerboard taps fw/bw and med/lat  Rockerboard balance fw/bw and med/lat    2'/2'  2'/2' // bars n    Marching gait   //bars   // bars y   Tandem walking on foam (FW only)   lateral walks 3'     5 laps in II -     Red above knees Y     y    NuStep    8  min  R6  y    Step ups     x10 (B)  6 in  y   Gait with 2WW 300' CGA, 300'CGA, 500' CGA   Y  n  n   Gait in II FW and BW x 5   y   rebounder 50x CGA n   HR/Toe raise 20x  //  bars y   [employer] walks //bars  x5 RTB n   Standing hip abd 2x10 RTB y   Leg press  2x10 60# y   SLS //  bars    Modified SLS  3 sec x5    10 sec x5 Single UE support  4 in step Y    y   [place] on airex   y   [place] //bars   y               Assessment: Noted patient is now able to go from sit to stand without UE assist. He continues to have festinating gait with fatigue and turns.      Short Term Goals: 4 Weeks              -pt will be able to navigate his home without AD and without falls (MET 07/06/2022)              -Pt will be be able to perform SLS for at least 10 sec B (PROGRESSING 3 sec 07/06/2022)  -Pt will be able to walk 300' with SPV (MET 07/06/2022)  - Pt will be able to perform STS from standard ht without AD. (MET 07/06/2022)        Long Term Goals: 8 Weeks              -Pt will be able to walk at least 1000' with LRAD and no unsteadiness in his legs to improve community ambulation. (Progressing with rest breaks 07/06/2022)              -Pt will be IND and compliant with HEP to improve carryover of gains made with PT. (MET 07/06/2022)              - Pt will improve B hip ext and abd MMT t38o at least 4+/5 to improve tolerance to standing and walking (PROGRESSING 07/06/2022)              - Pt will improve 4-Item DGI to at least 6 to reduce his risk for falls (PROGRESSING 07/06/2022)     Plan: continue progressing LE strength, coordination, balance, and safety. 3 visits left on current authorization.                       Total Session Time 38 and Timed code minutes 38  THERAPEUTIC EXERCISE 38 minutes      Trilby Leaver, PTA  07/20/2022, 13:59

## 2022-07-22 ENCOUNTER — Ambulatory Visit (HOSPITAL_COMMUNITY)
Admission: RE | Admit: 2022-07-22 | Discharge: 2022-07-22 | Disposition: A | Discharge status: Still In Hospital | Payer: 59 | Source: Ambulatory Visit

## 2022-07-22 NOTE — PT Treatment (Signed)
[facility] Medicine [facility]  Outpatient Physical Therapy  [address]  [place], [address]  [phone]  (Fax) [phone]    Physical Therapy Treatment Note    Date: 07/22/2022  Patient's Name: Tanner [name]  Date of Birth: 11-16-1940  Physical Therapy Visit      Visit #/POC: 13/15  Authorization: 12 of 15 Dates 05/21/22-10/06/22  POC Signed?: Yes  POC Ends: 08/03/2022     Evaluating Physical Therapist: Shirlean Schlein, PT, DPT  PT diagnosis/Reason for Referral: Low back pain and weakness     Subjective: Pt has brain MRI in [facility] pending then will f/u with Dr. Sedalia Muta. He is feeling much stronger but his balance is still not good and he is still fearful of falling.      Patient-Specific Functional Score:     Problem Score   1. Walking from his car to appt 7   2. Going up and down stairs 5   Total 6         Objective: Treatment delivered as follows:     Strength 4/25       right left   Hip flexion (L1,2) 4+ 4+   Hip extension (L5,S1,2) 4+ 4+   Knee flexion (S1) 4+ 4+   Knee extension (L2-4) 4+ 4+   Ankle DF (L4-5) 4+ 4+   Ankle PF (S1,2) 4+ 4+   Core Able to perform bed  mobility without UE --      07/22/22   Gait: INITIATION OF GAIT WITH HESITATION, short strides B with 2WW, able to walk >500' with increased fatigue      Balance: RH EO 30 sec, RH EC 10 sec, SLS 5 sec B, RH on foam req UE support     4-item DGI: gait 2, gait with changing speed 2, gait with head turns H1 V1 TOTAL=6  Treatment as noted below:     EXERCISE/ACTIVITY NAME REPETITIONS RESISTANCE COMPLETED THIS DOS   Walking     200 ft x3 FWW before and after treatment CGA y   STS no UE  Staggered sit to stand    2 x 10  X10 each 18 in  22 in Y  y   Gastroc stretch/ seated HS stretch/ seated piriformis stretch    2'/2x2'/2x2' - y   4" step overs     X 10 ea //bars Y   Rockerboard taps fw/bw and med/lat  Rockerboard balance fw/bw and med/lat    2'/2'  2'/2' // bars y   Agricultural engineer gait   //bars   // bars y   Tandem walking on foam (FW  only)   lateral walks 3'     5 laps in II -     Red above knees Y     y    NuStep    8  min  R6  n    Step ups fw and lateral     x10 (B)  6 in  y   Gait with 2WW 300' CGA, 300'CGA, 500' CGA   Y  y  n   Gait in II FW and BW x 5   y   Rocker  fw/bw and med/lat 50x  CGA y   HR/Toe raise 20x  //  bars y   Public librarian walks //bars  x5 RTB n   Standing hip abd 2x10 RTB y   Leg press  2x10 60# y   SLS // bars     Modified SLS  3 sec x5     10 sec x5 Single UE support  4 in step Y     y   [place] on airex     y   [place] //bars     y         Assessment: Pt has good tolerance to all exercises today but he still has poor balance with narrow BOS and on uneven surfaces. He has made excellent progress in strength and safety with gait as well as stair navigation.    Short Term Goals: 4 Weeks              -pt will be able to navigate his home without AD and without falls (MET 07/06/2022)              -Pt will be be able to perform SLS for at least 10 sec B (PROGRESSING 3 sec 07/06/2022)  -Pt will be able to walk 300' with SPV (MET 07/06/2022)  - Pt will be able to perform STS from standard ht without AD. (MET 07/06/2022)        Long Term Goals: 8 Weeks              -Pt will be able to walk at least 1000' with LRAD and no unsteadiness in his legs to improve community ambulation. (Progressing with rest breaks 07/06/2022)              -Pt will be IND and compliant with HEP to improve carryover of gains made with PT. (MET 07/06/2022)              - Pt will improve B hip ext and abd MMT t38o at least 4+/5 to improve tolerance to standing and walking (PROGRESSING 07/06/2022)              - Pt will improve 4-Item DGI to at least 6 to reduce his risk for falls (PROGRESSING 07/06/2022)     Plan: continue progressing LE strength, coordination, balance, and safety. 2 visits left on current authorization.     Total Session Time 40 and Timed code minutes 40  THERAPEUTIC EXERCISE 40 minutes      Shirlean Schlein, PT  07/22/2022, 14:14

## 2022-07-27 ENCOUNTER — Ambulatory Visit (HOSPITAL_COMMUNITY): Payer: Self-pay

## 2022-07-28 ENCOUNTER — Ambulatory Visit (HOSPITAL_COMMUNITY)
Admission: RE | Admit: 2022-07-28 | Discharge: 2022-07-28 | Disposition: A | Discharge status: Still In Hospital | Payer: 59 | Source: Ambulatory Visit

## 2022-07-28 NOTE — PT Treatment (Signed)
[facility] Medicine [facility]  Outpatient Physical Therapy  [address]  [place], [address]  [phone]  (Fax) [phone]    Physical Therapy Treatment Note    Date: 07/28/2022  Patient's Name: Tanner [name]  Date of Birth: Oct 01, 1940  Physical Therapy Visit      Visit #/POC: 14/15  Authorization: 14 of 15 Dates 05/21/22-10/06/22  POC Signed?: Yes  POC Ends: 08/03/2022     Evaluating Physical Therapist: Shirlean Schlein, PT, DPT  PT diagnosis/Reason for Referral: Low back pain and weakness     Subjective: Pt still has brain MRI pending. He is feeling fatigued today.      Patient-Specific Functional Score:     Problem Score   1. Walking from his car to appt 7   2. Going up and down stairs 5   Total 6         Objective: Treatment delivered as follows:     Strength 4/25       right left   Hip flexion (L1,2) 4+ 4+   Hip extension (L5,S1,2) 4+ 4+   Knee flexion (S1) 4+ 4+   Knee extension (L2-4) 4+ 4+   Ankle DF (L4-5) 4+ 4+   Ankle PF (S1,2) 4+ 4+   Core Able to perform bed  mobility without UE --      07/22/22   Gait: INITIATION OF GAIT WITH HESITATION, short strides B with 2WW, able to walk >500' with increased fatigue      Balance: RH EO 30 sec, RH EC 10 sec, SLS 5 sec B, RH on foam req UE support     4-item DGI: gait 2, gait with changing speed 2, gait with head turns H1 V1 TOTAL=6  Treatment as noted below:     EXERCISE/ACTIVITY NAME REPETITIONS RESISTANCE COMPLETED THIS DOS   Walking     200 ft x3 FWW before and after treatment CGA y   STS no UE  Staggered sit to stand    2 x 10  X10 each 18 in  22 in Y  y   Gastroc stretch/ seated HS stretch/ seated piriformis stretch    2'/2x2'/2x2' - y   4" step overs     X 10 ea //bars n   Rockerboard taps fw/bw and med/lat  Rockerboard balance fw/bw and med/lat    2'/2'  2'/2' // bars y   Agricultural engineer gait   //bars   // bars y   Tandem walking on foam (FW only)   lateral walks 3'     5 laps in II -     Red above knees n     n    NuStep    8  min  R6  y     Step ups fw and lateral     x10 (B)  6 in  y   Gait with 2WW 300' CGA, 300'CGA, 500' CGA   Y  y  y   Gait in II FW and BW x 5   y   Rocker  fw/bw and med/lat 50x  CGA n   HR/Toe raise 20x  //  bars y   [employer] walks //bars  x5 RTB n   Standing hip abd 2x10 RTB y   Leg press  2x10 60# n   SLS // bars     Modified SLS  3 sec x5     10 sec x5 Single UE support  4 in step Y  y   [place] on airex     n   [place] //bars     n   stairs 13 steps BHR y         Assessment: Pt was able to go up and down 1 FOS reciprocally Mod I today. He still demos increased festinating gait pattern which worsens with fatigue. He is able to perform STS without UE but is unsteady with immediate standing balance.      Short Term Goals: 4 Weeks              -pt will be able to navigate his home without AD and without falls (MET 07/06/2022)              -Pt will be be able to perform SLS for at least 10 sec B (PROGRESSING 3 sec 07/06/2022)  -Pt will be able to walk 300' with SPV (MET 07/06/2022)  - Pt will be able to perform STS from standard ht without AD. (MET 07/06/2022)        Long Term Goals: 8 Weeks              -Pt will be able to walk at least 1000' with LRAD and no unsteadiness in his legs to improve community ambulation. (Progressing with rest breaks 07/06/2022)              -Pt will be IND and compliant with HEP to improve carryover of gains made with PT. (MET 07/06/2022)              - Pt will improve B hip ext and abd MMT t38o at least 4+/5 to improve tolerance to standing and walking (PROGRESSING 07/06/2022)              - Pt will improve 4-Item DGI to at least 6 to reduce his risk for falls (PROGRESSING 07/06/2022)     Plan: continue progressing LE strength, coordination, balance, and safety. DC at next visit    Total Session Time 40 and Timed code minutes 40  THERAPEUTIC EXERCISE 40 minutes      Shirlean Schlein, PT  07/28/2022, 13:17

## 2022-07-29 ENCOUNTER — Ambulatory Visit (HOSPITAL_COMMUNITY): Payer: Self-pay

## 2022-07-30 ENCOUNTER — Other Ambulatory Visit: Payer: Self-pay

## 2022-07-30 ENCOUNTER — Ambulatory Visit
Admission: RE | Admit: 2022-07-30 | Discharge: 2022-07-30 | Disposition: A | Discharge status: Still In Hospital | Payer: 59 | Source: Ambulatory Visit | Attending: Internal Medicine | Admitting: Internal Medicine

## 2022-07-30 NOTE — PT Treatment (Addendum)
[facility] Medicine [facility]  Outpatient Physical Therapy  [address]  [place], [address]  [phone]  (Fax) [phone]    Physical Therapy Treatment Note    Date: 07/30/2022  Patient's Name: Tanner [name]  Date of Birth: [birth year]  Physical Therapy Visit      Physical Therapy Discharge Pt has demonstrated a plateau in progress and PT recs f/u with neurology ASAP. He has demonstrated important gains in strength, balance, and gait. He will likely benefit from additional PT in the future to further improve safety and gait, but he needs to complete brain MRI and f/u with Neurologist prior to continuation of skilled PT services. Shirlean Schlein, PT 07/30/2022 19:35     Visit #/POC: 15/15  Authorization: 15 of 15 Dates 05/21/22-10/06/22  POC Signed?: Yes  POC Ends: 08/03/2022     Evaluating Physical Therapist: Shirlean Schlein, PT, DPT  PT diagnosis/Reason for Referral: Low back pain and weakness     Subjective: Pt still has brain MRI pending. His wife will call again today to try and get an appointment day. States that he is very tired today and not walking well.       Patient-Specific Functional Score:4/25     Problem Score   1. Walking from his car to appt 7   2. Going up and down stairs 5   Total 6         Objective: Treatment delivered as follows:     Strength 4/25       right left   Hip flexion (L1,2) 4+ 4+   Hip extension (L5,S1,2) 4+ 4+   Knee flexion (S1) 4+ 4+   Knee extension (L2-4) 4+ 4+   Ankle DF (L4-5) 4+ 4+   Ankle PF (S1,2) 4+ 4+   Core Able to perform bed  mobility without UE --      07/22/22   Gait: INITIATION OF GAIT WITH HESITATION, short strides B with 2WW, able to walk >500' with increased fatigue      Balance: RH EO 30 sec, RH EC 10 sec, SLS 5 sec B, RH on foam req UE support     4-item DGI: gait 2, gait with changing speed 2, gait with head turns H1 V1 TOTAL=6  Treatment as noted below:     EXERCISE/ACTIVITY NAME REPETITIONS RESISTANCE COMPLETED THIS DOS   Walking     200  ft x3 FWW before and after treatment CGA y   STS no UE  Staggered sit to stand    2 x 10  X8 each 18 in  22 in Y  y   Gastroc stretch/ seated HS stretch/ seated piriformis stretch    2'/2x2'/2x2' - n   4" step overs     X 10 ea //bars n   Rockerboard taps fw/bw and med/lat  Rockerboard balance fw/bw and med/lat    2'/2'  2'/2' // bars y   Agricultural engineer gait   //bars   // bars y   Tandem walking on foam (FW only)   lateral walks 3'     5 laps in II -     Red above knees n     n    NuStep    10 min  R6  y    Step ups fw and lateral     x10 (B)  6 in  y   Gait with 2WW 300' CGA, 300'CGA, 500' CGA   Y  y  y   Conservation officer, nature  in II FW and BW x 5   y   Rocker  fw/bw and med/lat 50x  CGA n   HR/Toe raise 20x  //  bars y   [employer] walks //bars  x5 RTB n   Standing hip abd 2x10 RTB n   Leg press  2x10 60# n   SLS // bars     Modified SLS  3 sec x5     10 sec x5 Single UE support  4 in step Y     y   [place] on airex     n   [place] //bars     n   stairs 13 steps BHR n         Assessment: Pt currently has met 3 STGs and 1 LTG.  He continues to demonstrate festinating gait pattern. With turns and has he fatigues the festinating gait worsens.  He reports he is still waiting on an appointment for a brain MRI and appt with neurologist.      Short Term Goals: 4 Weeks              -pt will be able to navigate his home without AD and without falls (MET 07/06/2022)              -Pt will be be able to perform SLS for at least 10 sec B (PROGRESSING 3 sec 07/06/2022)  -Pt will be able to walk 300' with SPV (MET 07/06/2022)  - Pt will be able to perform STS from standard ht without AD. (MET 07/06/2022)        Long Term Goals: 8 Weeks              -Pt will be able to walk at least 1000' with LRAD and no unsteadiness in his legs to improve community ambulation. (Progressing with rest breaks 07/06/2022)              -Pt will be IND and compliant with HEP to improve carryover of gains made with PT. (MET 07/06/2022)              - Pt will improve B hip ext and abd MMT  t38o at least 4+/5 to improve tolerance to standing and walking (PROGRESSING 07/06/2022)              - Pt will improve 4-Item DGI to at least 6 to reduce his risk for falls (PROGRESSING 07/06/2022)     Plan: See PT note (above). DC PT        Total Session Time 38 and Timed code minutes 38  THERAPEUTIC EXERCISE 38 minutes      Trilby Leaver, PTA  07/30/2022, 13:16

## 2022-09-06 ENCOUNTER — Telehealth (INDEPENDENT_AMBULATORY_CARE_PROVIDER_SITE_OTHER): Payer: Self-pay | Admitting: NEUROLOGY

## 2022-09-06 NOTE — Telephone Encounter (Signed)
Pt wife left VM stating pt is not scheduled to see you for 3-4 months. He recently had "brain scans" in [place] and she is wanting those results. She said they can't wait 3 months. She was told to contact us because you had the results.

## 2022-09-07 NOTE — Telephone Encounter (Signed)
Pt wife notified to contact PCP that ordered test. She voiced understanding

## 2022-10-19 ENCOUNTER — Telehealth (INDEPENDENT_AMBULATORY_CARE_PROVIDER_SITE_OTHER): Payer: Self-pay | Admitting: NURSE PRACTITIONER

## 2022-10-19 NOTE — Telephone Encounter (Signed)
Call [facility] Watt Climes [phone] ext [phone] left message that M spike too small to quantify

## 2022-11-03 ENCOUNTER — Encounter (INDEPENDENT_AMBULATORY_CARE_PROVIDER_SITE_OTHER): Payer: Self-pay | Admitting: NEUROLOGY

## 2023-01-03 ENCOUNTER — Ambulatory Visit (INDEPENDENT_AMBULATORY_CARE_PROVIDER_SITE_OTHER): Payer: Self-pay | Admitting: NURSE PRACTITIONER

## 2023-01-03 ENCOUNTER — Encounter (INDEPENDENT_AMBULATORY_CARE_PROVIDER_SITE_OTHER): Payer: Self-pay

## 2023-01-13 ENCOUNTER — Encounter (INDEPENDENT_AMBULATORY_CARE_PROVIDER_SITE_OTHER): Payer: Self-pay | Admitting: NEUROLOGY

## 2023-01-13 DIAGNOSIS — R270 Ataxia, unspecified: Secondary | ICD-10-CM | POA: Insufficient documentation

## 2023-01-17 ENCOUNTER — Ambulatory Visit (INDEPENDENT_AMBULATORY_CARE_PROVIDER_SITE_OTHER): Payer: 59 | Admitting: NEUROLOGY

## 2023-01-17 ENCOUNTER — Other Ambulatory Visit: Payer: Self-pay

## 2023-01-17 ENCOUNTER — Encounter (INDEPENDENT_AMBULATORY_CARE_PROVIDER_SITE_OTHER): Payer: Self-pay | Admitting: NEUROLOGY

## 2023-01-17 VITALS — BP 122/70 | HR 67 | Temp 97.9°F | Ht 72.0 in | Wt 195.2 lb

## 2023-01-17 DIAGNOSIS — R269 Unspecified abnormalities of gait and mobility: Secondary | ICD-10-CM

## 2023-01-17 MED ORDER — CARBIDOPA 25 MG-LEVODOPA 100 MG TABLET
ORAL_TABLET | ORAL | 0 refills | Status: AC
Start: 2023-01-17 — End: 2023-02-22

## 2023-01-17 NOTE — Progress Notes (Signed)
Assessment & Plan  Gait disturbance  He is an 82 year old man who is referred for over 1 year history of gait disturbance. He had relatively abrupt onset of difficulty walking that improved slightly but never resolved to baseline. He was taken to the ED but did not have an MRI due to pacemaker implantation. 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 I very much doubt NPH as the etiology of his gait disturbance. We will attempt to obtain his MRI for my own review. I am most concerned that he suffered a cerebrovascular event that caused an acute decline in his gait. His walking his shuffling, however, there is little else to suggest Parkinson's. We will do a Sinemet trial and monitor response.     - Obtain previous imaging  - trial Sinemet 25-100 1 tab TID (titration disucssed)  - counseled on possible diagnoses     RTC 4 months    Thank you for allowing me to participate in your patient's care and please do not hesitate to contact me for any questions or concerns.    Patrick North, DO  Assistant Professor of Neurology  [facility]     I personally spent a total of 45 minutes today preparing to see the patient, in the encounter with the patient, and documenting after the visit.    [id number]: I will continue to be the provider focal point in managing the chronic complex neurological condition  ==========================================================================================================================================    NAME:  Tanner [name]  DOB:  [birth year]  VISIT DATE:  01/17/2023    CC:  gait disturbance    Patient seen in consultation at the request of Dr. Hewitt Shorts  History obtained from the patient and chart/records  Age of patient:  82 y.o.      HPI:   I had the pleasure of seeing your patient in neurology clinic for an outpatient consultation, who is a 82 y.o.  year old male who was referred for evaluation of gait disturbance.  Please allow me to summarize the history for the record.    He is joined by wife who gives additional details. He reports abrupt onset of gait disturbance over a year ago. He was in usual state of health then got out of the car and felt like legs gave out from under him. He was unable to get up and family took him to ED [facility]). He was unable to have MRI given pacemaker though rest of workup was unremarkable. He was discharged and over the following days his symptoms improved to the point that he could use a walker but have not improved past that point. He has been following with Dr. Andee Poles. He had an MRI at [facility] showing possible NPH and microhemorrhages. An LP was attempted but not obtained.   He has had some cognitive decline over a number of years and was placed on donepezil.   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)    Low back pain    Ataxia     Past Surgical History:   Procedure Laterality Date    CARDIAC PACEMAKER PLACEMENT  10/29/2021    DUAL CHAMBER PACEMAKER INSERTION WITH LEADS    HX APPENDECTOMY      HX  HEART SURGERY      open heart x2    HX MITRAL VALVE REPAIR      HX THYROIDECTOMY           Family Medical History:       Problem Relation (Age of Onset)    Cancer Mother    Heart Attack Father            Current Outpatient Medications   Medication Sig Dispense Refill    albuterol sulfate (PROVENTIL OR VENTOLIN OR PROAIR) 90 mcg/actuation Inhalation oral inhaler Take 2 Puffs by inhalation Once a day      aspirin 81 mg Oral Tablet, Chewable Chew 1 Tablet (81 mg total) Once a day      atorvastatin (LIPITOR) 80 mg Oral Tablet Take 1 Tablet (80 mg total) by mouth Every night       bromocriptine (PARLODEL) 2.5 mg Oral Tablet Take 1 Tablet (2.5 mg total) by mouth Once a day      calcium carbonate-vitamin D3 600 mg-10 mcg (400 unit) Oral Tablet Take 2 Tablets by mouth Once a day      diphenhydrAMINE (BENADRYL) 50 mg Oral Capsule Take 1 Capsule (50 mg total) by mouth Every night      ergocalciferol, vitamin D2, (DRISDOL) 1,250 mcg (50,000 unit) Oral Capsule Take 1 Capsule (50,000 Units total) by mouth Every 7 days      fluticasone propion-salmeteroL (ADVAIR) 500-50 mcg/dose Inhalation oral diskus inhaler Take 1 Puff (1 Inhalation total) by inhalation Twice daily      ibuprofen (MOTRIN) 400 mg Oral Tablet Take 1 Tablet (400 mg total) by mouth Twice per day as needed      ketotifen fumarate (EYE ITCH RELIEF) 0.025 % (0.035 %) Ophthalmic Drops Instill 1 Drop into both eyes Twice daily      levothyroxine (SYNTHROID) 137 mcg Oral Tablet Take 1 Tablet (137 mcg total) by mouth Once a day      losartan (COZAAR) 50 mg Oral Tablet Take 0.5 Tablets (25 mg total) by mouth Once a day      metoprolol succinate (TOPROL-XL) 25 mg Oral Tablet Sustained Release 24 hr Take 1 Tablet (25 mg total) by mouth Once a day 90 Tablet 4    omeprazole (PRILOSEC) 20 mg Oral Capsule, Delayed Release(E.C.) Take 1 Capsule (20 mg total) by mouth Once a day      polyethylene glycol (MIRALAX) 17 gram/dose Oral Powder TAKE 1 CAPFUL (17GM) BY MOUTH EVERY DAY (MIX POWDER IN 4 TO 8 OUNCES OF FLUID)      tamsulosin (FLOMAX) 0.4 mg Oral Capsule Take 1 Capsule (0.4 mg total) by mouth Every evening after dinner 30 Capsule 5    torsemide (DEMADEX) 20 mg Oral Tablet Take 0.5 Tablets (10 mg total) by mouth Once a day       No current facility-administered medications for this visit.     Allergies   Allergen Reactions    Sulfa (Sulfonamides) Rash and Nausea/ Vomiting     Social History     Socioeconomic History    Marital status: Married     Spouse name: Not on file    Number of children: Not on file    Years of education: Not on file     Highest education level: Not on file   Occupational History    Not on file   Tobacco Use    Smoking status: Former     Current packs/day: 0.00     Average packs/day: 1 pack/day  for 48.0 years (48.0 ttl pk-yrs)     Types: Cigarettes     Start date: 31     Quit date: 2003     Years since quitting: 21.8    Smokeless tobacco: Current     Types: Snuff   Vaping Use    Vaping status: Never Used   Substance and Sexual Activity    Alcohol use: Never    Drug use: Never    Sexual activity: Not Currently   Other Topics Concern    Not on file   Social History Narrative    Not on file     Social Determinants of Health     Financial Resource Strain: Low Risk  (10/13/2021)    Financial Resource Strain     SDOH Financial: No   Transportation Needs: Low Risk  (10/13/2021)    Transportation Needs     SDOH Transportation: No   Social Connections: Low Risk  (10/13/2021)    Social Connections     SDOH Social Isolation: 5 or more times a week   Intimate Partner Violence: Low Risk  (10/13/2021)    Intimate Partner Violence     SDOH Domestic Violence: No   Housing Stability: Low Risk  (10/13/2021)    Housing Stability     SDOH Housing Situation: I have housing.     SDOH Housing Worry: No       ============================================================================================================================================  GENERAL EXAMINATION  There were no vitals taken for this visit.      Vital signs personally reviewed  General: No acute distress, alert  HEENT: Normocephalic, no scleral icterus  Pulmonary: No accessory muscle use, no tachypnea  Cardiovascular: Heart with regular rate & rhythm  Extremities: No significant edema, No cyanosis    NEUROLOGIC EXAM  On neurological exam, patient was awake, alert and answering questions appropriately  Speech was fluent, without dysarthria or aphasia.    CN  II: not directly tested, grossly intact  III, IV, VI: extraocular movements intact without nystagmus  V: intact to light touch  VII:  face symmetric without weakness  VIII: grossly intact  IX, X: symmetric palatal elevation  XI: normal strength of trapezius and sternocleidomastoid bilaterally  XII: tongue midline with full movements    MOTOR  Bulk: normal  Tone: normal  Abnormal Movements: none  Fasciculations: none    Strength:     MRC Grading Scale   Right Left   Deltoid 5 5   Biceps 5 5   Triceps 5 5   Wrist Extension 5 5   Wrist Flexion - -   Finger Extension 5 5   Finger Abduction 5 5   Finger Flexion 5 5   Hip Flexion 5 5   Hip Extension - -   Hip Abduction - -   Hip Adduction - -   Knee Extension 5 5   Knee Flexion 5 5   Ankle Dorsiflexion 5 5   Ankle Plantarflexion 5 5   Toe Extension - -   Toe Flexion - -     REFLEXES   Right Left   Biceps 3 3   Triceps 2 2   Brachioradialis 2 2   Patellar 3 3   Achilles 0 0   Plantar - -   Hoffman - -   Pectoralis - -   Jaw Jerk - -       SENSORY  Light touch: intact throughout    GAIT  General: walks with rolling walker, very wide based,  slightly shuffling steps, not clearly magnetic     COORDINATION  Finger nose finger: normal    ================================================================================================================================LABS  Personal Review of prior labs is notable for:   2024  B12 355  A1c 5.9  CMP largely WNL   IMAGING  Personal Review of imaging is notable for:   MRI Brain w/o May 2024 - not available for direct review  IMPRESSION:     No acute intracranial pathology.     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  Recommend correlation with clinical presentation.  Other differential consideration includes central neuronal volume loss.      Age-related neuronal volume loss and microvascular ischemic white matter change.     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   Reviewed reports from MRI cervical and lumbar spine, no  significant canal stenosis  OTHER DIAGNOSTICS  Personal Review of other prior diagnostics is notable for: not applicable

## 2023-02-07 ENCOUNTER — Telehealth (INDEPENDENT_AMBULATORY_CARE_PROVIDER_SITE_OTHER): Payer: Self-pay | Admitting: NEUROLOGY

## 2023-02-07 NOTE — Nursing Note (Signed)
Pt wife notified. She states that pt didn't think medication was helping him at all. He is going to D/C taking

## 2023-02-07 NOTE — Telephone Encounter (Signed)
Pt wife called stating that the sinemet you put him on is making him very nauseous . She is wanting to know what he should do?

## 2023-05-10 ENCOUNTER — Encounter (INDEPENDENT_AMBULATORY_CARE_PROVIDER_SITE_OTHER): Payer: Self-pay | Admitting: NEUROLOGY

## 2023-05-16 ENCOUNTER — Encounter (INDEPENDENT_AMBULATORY_CARE_PROVIDER_SITE_OTHER): Payer: 59 | Admitting: NEUROLOGY

## 2023-05-25 ENCOUNTER — Encounter (HOSPITAL_COMMUNITY): Payer: Self-pay

## 2023-05-25 ENCOUNTER — Emergency Department (HOSPITAL_COMMUNITY): Payer: 59

## 2023-05-25 ENCOUNTER — Other Ambulatory Visit: Payer: Self-pay

## 2023-05-25 ENCOUNTER — Inpatient Hospital Stay
Admission: EM | Admit: 2023-05-25 | Discharge: 2023-06-01 | DRG: 177 | Disposition: A | Discharge status: Rehabilitation Facility | Payer: 59 | Attending: Internal Medicine | Admitting: Internal Medicine

## 2023-05-25 DIAGNOSIS — Z7951 Long term (current) use of inhaled steroids: Secondary | ICD-10-CM

## 2023-05-25 DIAGNOSIS — F0393 Unspecified dementia, unspecified severity, with mood disturbance: Secondary | ICD-10-CM | POA: Diagnosis present

## 2023-05-25 DIAGNOSIS — Z7982 Long term (current) use of aspirin: Secondary | ICD-10-CM

## 2023-05-25 DIAGNOSIS — Z951 Presence of aortocoronary bypass graft: Secondary | ICD-10-CM

## 2023-05-25 DIAGNOSIS — Z950 Presence of cardiac pacemaker: Secondary | ICD-10-CM

## 2023-05-25 DIAGNOSIS — I714 Abdominal aortic aneurysm, without rupture, unspecified: Secondary | ICD-10-CM | POA: Diagnosis present

## 2023-05-25 DIAGNOSIS — R270 Ataxia, unspecified: Secondary | ICD-10-CM | POA: Diagnosis present

## 2023-05-25 DIAGNOSIS — I2510 Atherosclerotic heart disease of native coronary artery without angina pectoris: Secondary | ICD-10-CM | POA: Diagnosis present

## 2023-05-25 DIAGNOSIS — Z87891 Personal history of nicotine dependence: Secondary | ICD-10-CM

## 2023-05-25 DIAGNOSIS — Z902 Acquired absence of lung [part of]: Secondary | ICD-10-CM

## 2023-05-25 DIAGNOSIS — U071 COVID-19: Principal | ICD-10-CM | POA: Diagnosis present

## 2023-05-25 DIAGNOSIS — E890 Postprocedural hypothyroidism: Secondary | ICD-10-CM | POA: Diagnosis present

## 2023-05-25 DIAGNOSIS — Z79890 Hormone replacement therapy (postmenopausal): Secondary | ICD-10-CM

## 2023-05-25 DIAGNOSIS — R29898 Other symptoms and signs involving the musculoskeletal system: Secondary | ICD-10-CM

## 2023-05-25 DIAGNOSIS — I5032 Chronic diastolic (congestive) heart failure: Secondary | ICD-10-CM | POA: Diagnosis present

## 2023-05-25 DIAGNOSIS — R531 Weakness: Secondary | ICD-10-CM

## 2023-05-25 DIAGNOSIS — J9601 Acute respiratory failure with hypoxia: Secondary | ICD-10-CM | POA: Diagnosis not present

## 2023-05-25 DIAGNOSIS — F17220 Nicotine dependence, chewing tobacco, uncomplicated: Secondary | ICD-10-CM | POA: Diagnosis present

## 2023-05-25 DIAGNOSIS — Z79899 Other long term (current) drug therapy: Secondary | ICD-10-CM

## 2023-05-25 DIAGNOSIS — F03911 Unspecified dementia, unspecified severity, with agitation: Secondary | ICD-10-CM | POA: Diagnosis not present

## 2023-05-25 HISTORY — DX: Depression, unspecified: F32.A

## 2023-05-25 HISTORY — DX: Presence of cardiac pacemaker: Z95.0

## 2023-05-25 HISTORY — DX: Atherosclerotic heart disease of native coronary artery without angina pectoris: I25.10

## 2023-05-25 HISTORY — DX: Pure hypercholesterolemia, unspecified: E78.00

## 2023-05-25 HISTORY — DX: Neoplasm of unspecified behavior of endocrine glands and other parts of nervous system: D49.7

## 2023-05-25 HISTORY — DX: Meniere's disease, unspecified ear: H81.09

## 2023-05-25 HISTORY — DX: Heart failure, unspecified: I50.9

## 2023-05-25 LAB — COMPREHENSIVE METABOLIC PANEL, NON-FASTING
ALBUMIN/GLOBULIN RATIO: 1.4 (ref 0.8–1.4)
ALBUMIN: 3.8 g/dL (ref 3.5–5.7)
ALKALINE PHOSPHATASE: 97 U/L (ref 34–104)
ALT (SGPT): 12 U/L (ref 7–52)
ANION GAP: 11 mmol/L (ref 4–13)
AST (SGOT): 18 U/L (ref 13–39)
BILIRUBIN TOTAL: 1 mg/dL (ref 0.3–1.0)
BUN/CREA RATIO: 17 (ref 6–22)
BUN: 18 mg/dL (ref 7–25)
CALCIUM, CORRECTED: 9.4 mg/dL (ref 8.9–10.8)
CALCIUM: 9.2 mg/dL (ref 8.6–10.3)
CHLORIDE: 105 mmol/L (ref 98–107)
CO2 TOTAL: 23 mmol/L (ref 21–31)
CREATININE: 1.09 mg/dL (ref 0.60–1.30)
ESTIMATED GFR: 67 mL/min/{1.73_m2} (ref >59)
GLOBULIN: 2.7 (ref 2.0–3.5)
GLUCOSE: 100 mg/dL (ref 74–109)
OSMOLALITY, CALCULATED: 280 mosm/kg (ref 270–290)
POTASSIUM: 4 mmol/L (ref 3.5–5.1)
PROTEIN TOTAL: 6.5 g/dL (ref 6.4–8.9)
SODIUM: 139 mmol/L (ref 136–145)

## 2023-05-25 LAB — CBC WITH DIFF
BASOPHIL #: 0 10*3/uL (ref 0.00–0.10)
BASOPHIL %: 0 % (ref 0–1)
EOSINOPHIL #: 0 10*3/uL (ref 0.00–0.50)
EOSINOPHIL %: 0 % — ABNORMAL LOW (ref 1–8)
HCT: 39.6 % (ref 36.7–47.1)
HGB: 13.2 g/dL (ref 12.5–16.3)
LYMPHOCYTE #: 0.6 10*3/uL — ABNORMAL LOW (ref 1.00–3.20)
LYMPHOCYTE %: 8 % — ABNORMAL LOW (ref 15–43)
MCH: 30.7 pg (ref 23.8–33.4)
MCHC: 33.5 g/dL (ref 32.5–36.3)
MCV: 91.6 fL (ref 73.0–96.2)
MONOCYTE #: 0.7 10*3/uL (ref 0.30–1.10)
MONOCYTE %: 10 % (ref 6–14)
MPV: 7.9 fL (ref 7.4–11.4)
NEUTROPHIL #: 6.2 10*3/uL (ref 1.70–7.60)
NEUTROPHIL %: 82 % — ABNORMAL HIGH (ref 44–74)
PLATELETS: 160 10*3/uL (ref 140–440)
RBC: 4.32 10*6/uL (ref 4.06–5.63)
RDW: 14.7 % (ref 12.1–16.2)
WBC: 7.6 10*3/uL (ref 3.6–10.2)

## 2023-05-25 LAB — PTT (PARTIAL THROMBOPLASTIN TIME): APTT: 34.1 s (ref 25.0–38.0)

## 2023-05-25 LAB — C-REACTIVE PROTEIN (CRP): C-REACTIVE PROTEIN (CRP): 3.6 mg/dL — ABNORMAL HIGH (ref 0.1–0.5)

## 2023-05-25 LAB — PT/INR
INR: 1.27 — ABNORMAL HIGH (ref 0.84–1.10)
PROTHROMBIN TIME: 14.3 s — ABNORMAL HIGH (ref 9.8–12.7)

## 2023-05-25 LAB — COVID-19, FLU A/B, RSV RAPID BY PCR
INFLUENZA VIRUS TYPE A: NOT DETECTED
INFLUENZA VIRUS TYPE B: NOT DETECTED
RESPIRATORY SYNCTIAL VIRUS (RSV): NOT DETECTED
SARS-CoV-2: DETECTED — AB

## 2023-05-25 LAB — RAPID THROAT SCREEN, STREPTOCOCCUS, WITH REFLEX: THROAT RAPID SCREEN, STREPTOCOCCUS: NEGATIVE

## 2023-05-25 LAB — LACTIC ACID LEVEL W/ REFLEX FOR LEVEL >2.0: LACTIC ACID: 1.8 mmol/L (ref 0.5–2.2)

## 2023-05-25 LAB — BLOOD BANK HOLD TUBE

## 2023-05-25 LAB — TROPONIN-I: TROPONIN I: 10 ng/L (ref <20)

## 2023-05-25 MED ORDER — ONDANSETRON HCL (PF) 4 MG/2 ML INJECTION SOLUTION
4.0000 mg | Freq: Four times a day (QID) | INTRAMUSCULAR | Status: DC | PRN
Start: 2023-05-25 — End: 2023-06-01
  Filled 2023-05-25: qty 2

## 2023-05-25 MED ORDER — ACETAMINOPHEN 325 MG TABLET
975.0000 mg | ORAL_TABLET | ORAL | Status: AC
Start: 2023-05-25 — End: 2023-05-25
  Administered 2023-05-25: 975 mg via ORAL

## 2023-05-25 MED ORDER — ALUMINUM-MAG HYDROXIDE-SIMETHICONE 200 MG-200 MG-20 MG/5 ML ORAL SUSP
30.0000 mL | ORAL | Status: DC | PRN
Start: 2023-05-25 — End: 2023-06-01
  Administered 2023-05-26: 30 mL via ORAL
  Filled 2023-05-25: qty 30

## 2023-05-25 MED ORDER — HEPARIN (PORCINE) 5,000 UNIT/ML INJECTION SOLUTION
5000.0000 [IU] | Freq: Three times a day (TID) | INTRAMUSCULAR | Status: DC
Start: 2023-05-26 — End: 2023-06-01
  Administered 2023-05-26 – 2023-06-01 (×19): 5000 [IU] via SUBCUTANEOUS
  Administered 2023-06-01: 0 [IU] via SUBCUTANEOUS
  Filled 2023-05-25 (×21): qty 1

## 2023-05-25 MED ORDER — SODIUM CHLORIDE 0.9 % (FLUSH) INJECTION SYRINGE
3.0000 mL | INJECTION | INTRAMUSCULAR | Status: DC | PRN
Start: 2023-05-25 — End: 2023-06-01

## 2023-05-25 MED ORDER — ACETAMINOPHEN 325 MG TABLET
ORAL_TABLET | ORAL | Status: AC
Start: 2023-05-25 — End: 2023-05-25
  Filled 2023-05-25: qty 3

## 2023-05-25 MED ORDER — DEXAMETHASONE SODIUM PHOSPHATE 4 MG/ML INJECTION SOLUTION
6.0000 mg | Freq: Every day | INTRAMUSCULAR | Status: DC
Start: 2023-05-25 — End: 2023-05-29

## 2023-05-25 MED ORDER — SODIUM CHLORIDE 0.9 % INTRAVENOUS SOLUTION
INTRAVENOUS | Status: DC
Start: 2023-05-26 — End: 2023-05-27
  Administered 2023-05-27: 0 mL via INTRAVENOUS

## 2023-05-25 MED ORDER — DEXTROMETHORPHAN-GUAIFENESIN 10 MG-100 MG/5 ML ORAL SYRUP
10.0000 mL | ORAL_SOLUTION | ORAL | Status: DC | PRN
Start: 2023-05-25 — End: 2023-06-01

## 2023-05-25 MED ORDER — ACETAMINOPHEN 325 MG TABLET
650.0000 mg | ORAL_TABLET | ORAL | Status: DC | PRN
Start: 2023-05-25 — End: 2023-06-01

## 2023-05-25 MED ORDER — SODIUM CHLORIDE 0.9 % (FLUSH) INJECTION SYRINGE
3.0000 mL | INJECTION | Freq: Three times a day (TID) | INTRAMUSCULAR | Status: DC
Start: 2023-05-25 — End: 2023-06-01
  Administered 2023-05-25: 0 mL
  Administered 2023-05-26 – 2023-05-27 (×5): 3 mL
  Administered 2023-05-27 – 2023-05-29 (×5): 0 mL
  Administered 2023-05-29: 3 mL
  Administered 2023-05-29: 0 mL
  Administered 2023-05-30 – 2023-05-31 (×5): 3 mL
  Administered 2023-05-31 – 2023-06-01 (×2): 0 mL
  Administered 2023-06-01: 3 mL

## 2023-05-25 MED ORDER — DIPHENHYDRAMINE 25 MG CAPSULE
25.0000 mg | ORAL_CAPSULE | Freq: Every evening | ORAL | Status: DC | PRN
Start: 2023-05-25 — End: 2023-06-01
  Administered 2023-05-28: 25 mg via ORAL
  Filled 2023-05-25: qty 1

## 2023-05-25 MED ORDER — IPRATROPIUM 0.5 MG-ALBUTEROL 3 MG (2.5 MG BASE)/3 ML NEBULIZATION SOLN
3.0000 mL | INHALATION_SOLUTION | Freq: Four times a day (QID) | RESPIRATORY_TRACT | Status: DC
Start: 2023-05-26 — End: 2023-05-26
  Administered 2023-05-26: 3 mL via RESPIRATORY_TRACT
  Filled 2023-05-25 (×2): qty 3

## 2023-05-25 MED ORDER — IPRATROPIUM 0.5 MG-ALBUTEROL 3 MG (2.5 MG BASE)/3 ML NEBULIZATION SOLN
3.0000 mL | INHALATION_SOLUTION | RESPIRATORY_TRACT | Status: DC | PRN
Start: 2023-05-25 — End: 2023-06-01
  Administered 2023-05-31: 0 mL via RESPIRATORY_TRACT

## 2023-05-25 NOTE — H&P (Signed)
 [facility]    HOSPITALIST H&P    Tanner [name] 83 y.o. male ED14/ED14   Date of Service: 05/25/2023    Date of Admission:  05/25/2023   PCP: Craig Staggers, MD Code Status:FULL CODE: ATTEMPT RESUSCITATION/CPR       Chief Complaint:  " Subjective fever, chills, body aches, shortness for breath, nonproductive cough, weakness, difficulty walking for last 2 weeks "    HPI:   Tanner [name] is an 83 year old male who presented to the emergency room on 09/22/2023 with complaints being sick for about 2 weeks, with increased weakness and difficulty ambulating.  Patient also reports subjective fever, chills, body aches, decreased oral intake, shortness for breath, nonproductive cough.  Son states that he has had symptoms for the entire 2 weeks, but has difficulty walking for longer than that.  The patient denies having any nausea, vomiting, diarrhea, chest pain, syncope/presyncope.  Labs upon arrival to the ER were as follows: PT 14.3, INR 1.27, CRP 3.6.  Respiratory 4 Plex positive for COVID.  Blood cultures x2 pending.  Chest x-ray negative.  The patient was seen and examined for hospitalist admission while in ER.  Son at bedside.        ED medications:   Medications Ordered/Administered in the ED   acetaminophen (TYLENOL) tablet (975 mg Oral Given 05/25/23 2004)         PMHx:    Past Medical History:   Diagnosis Date    Ataxia     Congestive heart failure (CMS HCC)     Coronary artery disease     Depression     Disorder of thyroid     Esophageal reflux     Heart disease     Hx of thyroid cancer     Hypercholesterolemia     Hyperlipidemia     Hypertension     Lung cancer (CMS HCC)     Meniere disease     Pacemaker     Pituitary tumor         PSHx:   Past Surgical History:   Procedure Laterality Date    CARDIAC PACEMAKER PLACEMENT  10/29/2021    DUAL CHAMBER PACEMAKER INSERTION WITH LEADS    HX APPENDECTOMY      HX CATARACT REMOVAL Bilateral     HX CORONARY ARTERY BYPASS GRAFT      HX HEART  SURGERY      open heart x2    HX MITRAL VALVE REPAIR      HX THYROIDECTOMY      LUNG REMOVAL, PARTIAL Right           Allergies:    Allergies   Allergen Reactions    Sulfa (Sulfonamides) Rash and Nausea/ Vomiting    Social History  Social History     Tobacco Use    Smoking status: Former     Current packs/day: 0.00     Average packs/day: 1 pack/day for 48.0 years (48.0 ttl pk-yrs)     Types: Cigarettes     Start date: 72     Quit date: 2003     Years since quitting: 22.1    Smokeless tobacco: Current     Types: Snuff   Vaping Use    Vaping status: Never Used   Substance Use Topics    Alcohol use: Never    Drug use: Never       Family History  Family Medical History:       Problem  Relation (Age of Onset)    Cancer Mother    Heart Attack Father               Home Meds:      Prior to Admission medications    Medication Sig Start Date End Date Taking? Authorizing Provider   albuterol sulfate (PROVENTIL OR VENTOLIN OR PROAIR) 90 mcg/actuation Inhalation oral inhaler Take 2 Puffs by inhalation Once a day  Patient not taking: Reported on 01/17/2023 04/22/21   Provider, Historical   aspirin 81 mg Oral Tablet, Chewable Chew 1 Tablet (81 mg total) Once a day 12/08/05   Provider, Historical   atorvastatin (LIPITOR) 80 mg Oral Tablet Take 1 Tablet (80 mg total) by mouth Every night 04/22/21   Provider, Historical   bromocriptine (PARLODEL) 2.5 mg Oral Tablet Take 1 Tablet (2.5 mg total) by mouth Once a day 04/22/21   Provider, Historical   calcium carbonate-vitamin D3 600 mg-10 mcg (400 unit) Oral Tablet Take 2 Tablets by mouth Once a day 04/22/21   Provider, Historical   diphenhydrAMINE (BENADRYL) 50 mg Oral Capsule Take 1 Capsule (50 mg total) by mouth Every night 04/22/21   Provider, Historical   donepeziL (ARICEPT) 10 mg Oral Tablet Take 1 Tablet (10 mg total) by mouth Every night 12/09/22   Provider, Historical   ergocalciferol, vitamin D2, (DRISDOL) 1,250 mcg (50,000 unit) Oral Capsule Take 1 Capsule (50,000 Units total) by  mouth Every 7 days  Patient not taking: Reported on 01/17/2023 06/21/22   Provider, Historical   fluticasone propion-salmeteroL (ADVAIR) 500-50 mcg/dose Inhalation oral diskus inhaler Take 1 Puff (1 Inhalation total) by inhalation Twice daily  Patient not taking: Reported on 01/17/2023 04/22/21   Provider, Historical   ibuprofen (MOTRIN) 400 mg Oral Tablet Take 1 Tablet (400 mg total) by mouth Twice per day as needed 04/22/21   Provider, Historical   ketotifen fumarate (EYE ITCH RELIEF) 0.025 % (0.035 %) Ophthalmic Drops Instill 1 Drop into both eyes Twice daily 04/22/21   Provider, Historical   levothyroxine (SYNTHROID) 137 mcg Oral Tablet Take 1 Tablet (137 mcg total) by mouth Once a day 12/06/17   Provider, Historical   losartan (COZAAR) 50 mg Oral Tablet Take 0.5 Tablets (25 mg total) by mouth Once a day 04/22/21   Provider, Historical   metoprolol succinate (TOPROL-XL) 25 mg Oral Tablet Sustained Release 24 hr Take 1 Tablet (25 mg total) by mouth Once a day 10/30/21   Rhina Brackett, MD   omeprazole (PRILOSEC) 20 mg Oral Capsule, Delayed Release(E.C.) Take 1 Capsule (20 mg total) by mouth Once a day 04/22/21   Provider, Historical   polyethylene glycol (MIRALAX) 17 gram/dose Oral Powder TAKE 1 CAPFUL (17GM) BY MOUTH EVERY DAY (MIX POWDER IN 4 TO 8 OUNCES OF FLUID) 10/22/21   Provider, Historical   tamsulosin (FLOMAX) 0.4 mg Oral Capsule Take 1 Capsule (0.4 mg total) by mouth Every evening after dinner 10/13/21   Arlana Hove, DO   torsemide (DEMADEX) 20 mg Oral Tablet Take 0.5 Tablets (10 mg total) by mouth Once a day 04/22/21   Provider, Historical          ROS:   Review of systems completed and was negative except for what was mentioned in HPI.      Results for orders placed or performed during the hospital encounter of 05/25/23 (from the past 24 hours)   ECG 12 LEAD   Result Value Ref Range    Ventricular rate 64 BPM  Atrial Rate 64 BPM    PR Interval 246 ms    QRS Duration 160 ms    QT Interval 470 ms    QTC  Calculation 484 ms    Calculated P Axis 36 degrees    Calculated R Axis -18 degrees    Calculated T Axis -11 degrees   COMPREHENSIVE METABOLIC PANEL, NON-FASTING   Result Value Ref Range    SODIUM 139 136 - 145 mmol/L    POTASSIUM 4.0 3.5 - 5.1 mmol/L    CHLORIDE 105 98 - 107 mmol/L    CO2 TOTAL 23 21 - 31 mmol/L    ANION GAP 11 4 - 13 mmol/L    BUN 18 7 - 25 mg/dL    CREATININE 5.78 4.69 - 1.30 mg/dL    BUN/CREA RATIO 17 6 - 22    ESTIMATED GFR 67 >59 mL/min/1.62m^2    ALBUMIN 3.8 3.5 - 5.7 g/dL    CALCIUM 9.2 8.6 - 62.9 mg/dL    GLUCOSE 528 74 - 413 mg/dL    ALKALINE PHOSPHATASE 97 34 - 104 U/L    ALT (SGPT) 12 7 - 52 U/L    AST (SGOT) 18 13 - 39 U/L    BILIRUBIN TOTAL 1.0 0.3 - 1.0 mg/dL    PROTEIN TOTAL 6.5 6.4 - 8.9 g/dL    ALBUMIN/GLOBULIN RATIO 1.4 0.8 - 1.4    OSMOLALITY, CALCULATED 280 270 - 290 mOsm/kg    CALCIUM, CORRECTED 9.4 8.9 - 10.8 mg/dL    GLOBULIN 2.7 2.0 - 3.5   COVID-19, FLU A/B, RSV RAPID BY PCR   Result Value Ref Range    SARS-CoV-2 Detected (A) Not Detected    INFLUENZA VIRUS TYPE A Not Detected Not Detected    INFLUENZA VIRUS TYPE B Not Detected Not Detected    RESPIRATORY SYNCTIAL VIRUS (RSV) Not Detected Not Detected   C-REACTIVE PROTEIN (CRP)   Result Value Ref Range    C-REACTIVE PROTEIN (CRP) 3.6 (H) 0.1 - 0.5 mg/dL   LACTIC ACID LEVEL W/ REFLEX FOR LEVEL >2.0   Result Value Ref Range    LACTIC ACID 1.8 0.5 - 2.2 mmol/L   CBC WITH DIFF   Result Value Ref Range    WBC 7.6 3.6 - 10.2 x10^3/uL    RBC 4.32 4.06 - 5.63 x10^6/uL    HGB 13.2 12.5 - 16.3 g/dL    HCT 24.4 01.0 - 27.2 %    MCV 91.6 73.0 - 96.2 fL    MCH 30.7 23.8 - 33.4 pg    MCHC 33.5 32.5 - 36.3 g/dL    RDW 53.6 64.4 - 03.4 %    PLATELETS 160 140 - 440 x10^3/uL    MPV 7.9 7.4 - 11.4 fL    NEUTROPHIL % 82 (H) 44 - 74 %    LYMPHOCYTE % 8 (L) 15 - 43 %    MONOCYTE % 10 6 - 14 %    EOSINOPHIL % 0 (L) 1 - 8 %    BASOPHIL % 0 0 - 1 %    NEUTROPHIL # 6.20 1.70 - 7.60 x10^3/uL    LYMPHOCYTE # 0.60 (L) 1.00 - 3.20 x10^3/uL    MONOCYTE #  0.70 0.30 - 1.10 x10^3/uL    EOSINOPHIL # 0.00 0.00 - 0.50 x10^3/uL    BASOPHIL # 0.00 0.00 - 0.10 x10^3/uL   TROPONIN-I   Result Value Ref Range    TROPONIN I 10 <20 ng/L   PT/INR   Result Value  Ref Range    PROTHROMBIN TIME 14.3 (H) 9.8 - 12.7 seconds    INR 1.27 (H) 0.84 - 1.10   PTT (PARTIAL THROMBOPLASTIN TIME)   Result Value Ref Range    APTT 34.1 25.0 - 38.0 seconds   BLOOD BANK HOLD TUBE   Result Value Ref Range    SPECIMEN EXPIRATION DATE 05/28/2023,2359           Physical:  Filed Vitals:    05/25/23 1934   BP: 109/67   Pulse: 65   Resp: 18   Temp: (!) 38.4 C (101.1 F)   SpO2: 96%      General: Patient is alert and oriented to person, place, and time. No acute distress. Communicates appropriately.  Hard of hearing   Neck: Supple. No cervical lymphadenopathy or supraclavicular nodes detected. Trachea midline   Heart: Regular rate and rhythm.  Paced rhythm with rate in the 60s.  Pacemaker generator to left chest wall.  S1 & S2 present. No S3 or S4. No rubs, gallops, or murmurs appreciated.  Radial and dorsalis pedis pulses +2/4 bilaterally.  Brisk capillary refill.    Lungs:  Diminished to auscultation bilaterally with no wheezes or rales. Equal chest excursion.  No conversational dyspnea. No respiratory distress noted.  Oxygen saturation 90% on room air.  Abdomen: Soft, nontender, nondistended belly. Bowel sounds are present in all four quadrants. No rigidity.  No guarding.  No ascites.   Extremities:  Mild 1+ pitting edema to bilateral lower extremities.  Patient reports noncompliance with use of diuretics at home.  Grossly moves all extremities.  Patient able to lift both legs off of bed, but states has been unable to live feet to walk when standing.  Skin: Warm and dry without lesions. No ecchymosis noted.    Neurologic: Cranial nerves II through XII are grossly intact. Sensation to light touch is intact.   Genitourinary:  No urinary incontinence or Foley catheter   Psychiatric: Judgment and insight are  intact. Mood and affect are appropriate for the situation.         Assessments:  Active Hospital Problems   (*Primary Problem)    Diagnosis    *COVID-19    Generalized weakness    Ataxia    Chronic diastolic congestive heart failure (CMS HCC)     Chronic     COVID-19   Patient has had symptoms for approximately 2 weeks.  Patient is out of the window for getting remdesivir IV.  Patient ordered duo nebs q.i.d. and q.4 hours p.r.n..  He was also ordered Robitussin DM for cough.  Dexamethasone 6 mg IV daily ordered starting tonight.  Patient was on room air at time of examination with sat of 90%, so oxygen ordered at 2 L to keep sat between 90 and 92%.    2.  Generalized weakness       Ataxia   Patient states that he is able to move legs off of bed, but is unable to lift feet when trying to ambulate.  He states that he has been weak for a long time and uses walker for ambulation, but symptoms have significantly worsened over last 2 weeks during illness.  Physical therapy and occupational therapy consults ordered.  Patient is to be out of bed with assistance only.    3.  Chronic diastolic CHF   Patient denied history of CHF, but documentation of CHF noted in history.  Patient will be rehydrated with normal saline at 75 mL/hour for gentle  hydration.  We will monitor closely for signs of fluid overload.    Plan:  Patient will be placed in observation for the above problems.  Patient will be monitored on telemetry.  Home medications will be resumed as appropriate once confirmed.  Labs ordered for a.m.Marland Kitchen  Case management consulted for discharge planning needs.  Further orders will depend upon clinical course.  The Hospitalist personally evaluated and examined the patient in conjunction with the MLP and agree with the assessments, treatment plan and disposition of the patient as recorded by the [facility].      Code status: FULL CODE: ATTEMPT RESUSCITATION/CPR  DVT prophylaxis:  Heparin    Diet: DIET CARDIAC (2G NA, LOWFAT, LOW CHOL)  Do you want to initiate MNT Protocol? Yes    Disposition:  The patient is currently acutely ill requiring treatment on the medical floor for observation. Patient will be closely evaluated monitor and remove be adjusted accordingly.  Estimated length of stay less than 48 hr to obtain full medical treatment.      Stanford Breed, FNP-BC    [facility] MEDICINE HOSPITALIST

## 2023-05-25 NOTE — ED Provider Notes (Signed)
 [facility] - Emergency Department  ED Primary Provider Note  History of Present Illness   Chief Complaint   Patient presents with    Weakness     Tanner [name] is a 83 y.o. male who had concerns including Weakness.  Arrival: The patient arrived by Ambulance    Patient is an 83 year old male past medical history of coronary artery disease status post bypass 2 years ago well as thyroid cancer presents emergency department with complaints of generalized weakness.  States it has been getting worse over the past couple of weeks.  He states when he stands he feels like his legs can not support him.  He states he has a productive cough with white chalky sputum.  He does report some mild shortness of breath.  Denies chest discomfort denies abdominal pain he does report decreased appetite.  Denies nausea vomiting urinary symptoms or diarrhea.  Patient presents febrile T-max 101.1      History Reviewed This Encounter: Medical History  Surgical History  Family History  Social History    Physical Exam   ED Triage Vitals [05/25/23 1934]   BP (Non-Invasive) 109/67   Heart Rate 65   Respiratory Rate 18   Temperature (!) 38.4 C (101.1 F)   SpO2 96 %   Weight 88.5 kg (195 lb)   Height 1.829 m (6')     Physical Exam  Constitutional:       Appearance: Normal appearance.   HENT:      Head: Normocephalic.      Nose: Nose normal.      Mouth/Throat:      Mouth: Mucous membranes are moist.   Eyes:      Extraocular Movements: Extraocular movements intact.      Pupils: Pupils are equal, round, and reactive to light.   Cardiovascular:      Rate and Rhythm: Normal rate and regular rhythm.      Pulses: Normal pulses.      Heart sounds: Normal heart sounds.   Pulmonary:      Effort: Pulmonary effort is normal.      Breath sounds: Normal breath sounds.   Abdominal:      General: Abdomen is flat. There is no distension.      Palpations: Abdomen is soft.      Tenderness: There is no abdominal tenderness.    Musculoskeletal:      Cervical back: Normal range of motion and neck supple.   Neurological:      Mental Status: He is alert.       Patient Data     Labs Ordered/Reviewed   COVID-19, FLU A/B, RSV RAPID BY PCR - Abnormal; Notable for the following components:       Result Value    SARS-CoV-2 Detected (*)     All other components within normal limits    Narrative:     Results are for the simultaneous qualitative identification of SARS-CoV-2 (formerly 2019-nCoV), Influenza A, Influenza B, and RSV RNA. These etiologic agents are generally detectable in nasopharyngeal and nasal swabs during the ACUTE PHASE of infection. Hence, this test is intended to be performed on respiratory specimens collected from individuals with signs and symptoms of upper respiratory tract infection who meet Centers for Disease Control and Prevention (CDC) clinical and/or epidemiological criteria for Coronavirus Disease 2019 (COVID-19) testing. CDC COVID-19 criteria for testing on human specimens is available at [facility] webpage information for Healthcare Professionals: Coronavirus Disease 2019 (COVID-19) ([email or web address]).  False-negative results may occur if the virus has genomic mutations, insertions, deletions, or rearrangements or if performed very early in the course of illness. Otherwise, negative results indicate virus specific RNA targets are not detected, however negative results do not preclude SARS-CoV-2 infection/COVID-19, Influenza, or Respiratory syncytial virus infection. Results should not be used as the sole basis for patient management decisions. Negative results must be combined with clinical observations, patient history, and epidemiological information. If upper respiratory tract infection is still suspected based on exposure history together with other clinical findings, re-testing should be considered.    Test methodology:   Cepheid Xpert Xpress SARS-CoV-2/Flu/RSV Assay real-time  polymerase chain reaction (RT-PCR) test on the GeneXpert Dx and Xpert Xpress systems.   C-REACTIVE PROTEIN (CRP) - Abnormal; Notable for the following components:    C-REACTIVE PROTEIN (CRP) 3.6 (*)     All other components within normal limits   CBC WITH DIFF - Abnormal; Notable for the following components:    NEUTROPHIL % 82 (*)     LYMPHOCYTE % 8 (*)     EOSINOPHIL % 0 (*)     LYMPHOCYTE # 0.60 (*)     All other components within normal limits   PT/INR - Abnormal; Notable for the following components:    PROTHROMBIN TIME 14.3 (*)     INR 1.27 (*)     All other components within normal limits    Narrative:     In the setting of warfarin therapy, a moderate-intensity INR goal range is 2.0 to 3.0 and a high-intensity INR goal range is 2.5 to 3.5.    INR is ONLY validated to determine the level of anticoagulation with vitamin K antagonists (warfarin). Other factors may elevate the INR including but not limited to direct oral anticoagulants (DOACs), liver dysfunction, vitamin K deficiency, DIC, factor deficiencies, and factor inhibitors.   RAPID THROAT SCREEN, STREPTOCOCCUS, WITH REFLEX - Normal   COMPREHENSIVE METABOLIC PANEL, NON-FASTING - Normal    Narrative:     Estimated Glomerular Filtration Rate (eGFR) is calculated using the CKD-EPI (2021) equation, intended for patients 30 years of age and older. If gender is not documented or "unknown", there will be no eGFR calculation.     LACTIC ACID LEVEL W/ REFLEX FOR LEVEL >2.0 - Normal   TROPONIN-I - Normal   PTT (PARTIAL THROMBOPLASTIN TIME) - Normal   ADULT ROUTINE BLOOD CULTURE, SET OF 2 BOTTLES (BACTERIA AND YEAST)   ADULT ROUTINE BLOOD CULTURE, SET OF 2 BOTTLES (BACTERIA AND YEAST)   THROAT CULTURE, BETA HEMOLYTIC STREPTOCOCCUS   CBC/DIFF    Narrative:     The following orders were created for panel order CBC/DIFF.  Procedure                               Abnormality         Status                     ---------                               -----------          ------                     CBC WITH FAOZ[[id number]]                Abnormal  Final result                 Please view results for these tests on the individual orders.   EXTRA TUBES    Narrative:     The following orders were created for panel order EXTRA TUBES.  Procedure                               Abnormality         Status                     ---------                               -----------         ------                     BLOOD BANK HOLD [id number]                             Final result                 Please view results for these tests on the individual orders.   URINALYSIS, MACROSCOPIC AND MICROSCOPIC W/CULTURE REFLEX    Narrative:     The following orders were created for panel order URINALYSIS, MACROSCOPIC AND MICROSCOPIC W/CULTURE REFLEX [PRN ONLY].  Procedure                               Abnormality         Status                     ---------                               -----------         ------                     URINALYSIS, MACROSCOPIC[[id number]]                                                     URINALYSIS, MICROSCOPIC[[id number]]                                                       Please view results for these tests on the individual orders.   URINALYSIS, MACROSCOPIC   URINALYSIS, MICROSCOPIC   BLOOD BANK HOLD TUBE     XR AP MOBILE CHEST   Final Result by Edi, Radresults In (02/26 2024)   NO ACUTE FINDINGS.            Radiologist location ID: [id number]           Medical Decision Making        Medical Decision Making  Patient ultimately tested positive for COVID CBC metabolic panel unremarkable EKG demonstrated no acute ischemic change  troponins within normal limits patient's chest x-ray was clear.  Not requiring oxygen.  Patient did have weakness and has risk factors.  Discussed case with the hospital team agreed evaluate patient for admission    Amount and/or Complexity of Data Reviewed  Labs: ordered.  Radiology: ordered.  ECG/medicine tests: ordered.    Risk  OTC  drugs.  Decision regarding hospitalization.                Medications Ordered/Administered in the ED   acetaminophen (TYLENOL) tablet (975 mg Oral Given 05/25/23 2004)     Clinical Impression   COVID-19 (Primary)   Generalized weakness       Disposition: Admitted

## 2023-05-25 NOTE — ED Triage Notes (Signed)
 EMS called for generalized weakness. Pt informed EMS that, "when he stands, he can't feel his legs." BS 115.

## 2023-05-26 ENCOUNTER — Observation Stay (HOSPITAL_COMMUNITY): Payer: 59

## 2023-05-26 DIAGNOSIS — I440 Atrioventricular block, first degree: Secondary | ICD-10-CM

## 2023-05-26 DIAGNOSIS — R9431 Abnormal electrocardiogram [ECG] [EKG]: Secondary | ICD-10-CM

## 2023-05-26 DIAGNOSIS — J9601 Acute respiratory failure with hypoxia: Secondary | ICD-10-CM | POA: Diagnosis present

## 2023-05-26 DIAGNOSIS — I4510 Unspecified right bundle-branch block: Secondary | ICD-10-CM

## 2023-05-26 LAB — CBC WITH DIFF
BASOPHIL #: 0 10*3/uL (ref 0.00–0.10)
BASOPHIL %: 0 % (ref 0–1)
EOSINOPHIL #: 0 10*3/uL (ref 0.00–0.50)
EOSINOPHIL %: 0 % — ABNORMAL LOW (ref 1–8)
HCT: 36.3 % — ABNORMAL LOW (ref 36.7–47.1)
HGB: 12.4 g/dL — ABNORMAL LOW (ref 12.5–16.3)
LYMPHOCYTE #: 0.6 10*3/uL — ABNORMAL LOW (ref 1.00–3.20)
LYMPHOCYTE %: 9 % — ABNORMAL LOW (ref 15–43)
MCH: 31 pg (ref 23.8–33.4)
MCHC: 34 g/dL (ref 32.5–36.3)
MCV: 91.3 fL (ref 73.0–96.2)
MONOCYTE #: 0.7 10*3/uL (ref 0.30–1.10)
MONOCYTE %: 11 % (ref 6–14)
MPV: 7.9 fL (ref 7.4–11.4)
NEUTROPHIL #: 5.2 10*3/uL (ref 1.70–7.60)
NEUTROPHIL %: 80 % — ABNORMAL HIGH (ref 44–74)
PLATELETS: 147 10*3/uL (ref 140–440)
RBC: 3.98 10*6/uL — ABNORMAL LOW (ref 4.06–5.63)
RDW: 14.7 % (ref 12.1–16.2)
WBC: 6.5 10*3/uL (ref 3.6–10.2)

## 2023-05-26 LAB — COMPREHENSIVE METABOLIC PANEL, NON-FASTING
ALBUMIN/GLOBULIN RATIO: 1.3 (ref 0.8–1.4)
ALBUMIN: 3.4 g/dL — ABNORMAL LOW (ref 3.5–5.7)
ALKALINE PHOSPHATASE: 87 U/L (ref 34–104)
ALT (SGPT): 10 U/L (ref 7–52)
ANION GAP: 6 mmol/L (ref 4–13)
AST (SGOT): 17 U/L (ref 13–39)
BILIRUBIN TOTAL: 1.1 mg/dL — ABNORMAL HIGH (ref 0.3–1.0)
BUN/CREA RATIO: 18 (ref 6–22)
BUN: 19 mg/dL (ref 7–25)
CALCIUM, CORRECTED: 9.3 mg/dL (ref 8.9–10.8)
CALCIUM: 8.8 mg/dL (ref 8.6–10.3)
CHLORIDE: 106 mmol/L (ref 98–107)
CO2 TOTAL: 26 mmol/L (ref 21–31)
CREATININE: 1.04 mg/dL (ref 0.60–1.30)
ESTIMATED GFR: 71 mL/min/{1.73_m2} (ref >59)
GLOBULIN: 2.6 (ref 2.0–3.5)
GLUCOSE: 110 mg/dL — ABNORMAL HIGH (ref 74–109)
OSMOLALITY, CALCULATED: 279 mosm/kg (ref 270–290)
POTASSIUM: 3.8 mmol/L (ref 3.5–5.1)
PROTEIN TOTAL: 6 g/dL — ABNORMAL LOW (ref 6.4–8.9)
SODIUM: 138 mmol/L (ref 136–145)

## 2023-05-26 LAB — URINALYSIS, MACROSCOPIC
BILIRUBIN: NEGATIVE mg/dL
BLOOD: 0.03 mg/dL
GLUCOSE: NEGATIVE mg/dL
KETONES: NEGATIVE mg/dL
LEUKOCYTES: NEGATIVE WBCs/uL
NITRITE: NEGATIVE
PH: 5 (ref 5.0–9.0)
PROTEIN: NEGATIVE mg/dL
SPECIFIC GRAVITY: 1.018 (ref 1.002–1.030)
UROBILINOGEN: NORMAL mg/dL

## 2023-05-26 LAB — ECG 12 LEAD
Atrial Rate: 64 {beats}/min
Calculated P Axis: 36 degrees
Calculated R Axis: -18 degrees
Calculated T Axis: -11 degrees
PR Interval: 246 ms
QRS Duration: 160 ms
QT Interval: 470 ms
QTC Calculation: 484 ms
Ventricular rate: 64 {beats}/min

## 2023-05-26 LAB — URINALYSIS, MICROSCOPIC
HYALINE CASTS: 3 /[LPF] — ABNORMAL HIGH (ref <0)
RBCS: 1 /[HPF] (ref <4)
WBCS: 1 /[HPF] (ref <6)

## 2023-05-26 LAB — CREATINE KINASE (CK), TOTAL, SERUM OR PLASMA: CREATINE KINASE: 233 U/L — ABNORMAL HIGH (ref 30–223)

## 2023-05-26 LAB — MAGNESIUM: MAGNESIUM: 1.9 mg/dL (ref 1.9–2.7)

## 2023-05-26 MED ORDER — PANTOPRAZOLE 40 MG TABLET,DELAYED RELEASE
DELAYED_RELEASE_TABLET | ORAL | Status: AC
Start: 2023-05-26 — End: 2023-05-26
  Administered 2023-05-26: 40 mg via ORAL
  Filled 2023-05-26: qty 1

## 2023-05-26 MED ORDER — LOSARTAN 50 MG TABLET
25.0000 mg | ORAL_TABLET | Freq: Every day | ORAL | Status: DC
Start: 2023-05-26 — End: 2023-06-01
  Administered 2023-05-27 – 2023-06-01 (×6): 25 mg via ORAL
  Filled 2023-05-26 (×6): qty 1

## 2023-05-26 MED ORDER — TORSEMIDE 5 MG TABLET
10.0000 mg | ORAL_TABLET | Freq: Every day | ORAL | Status: DC
Start: 2023-05-26 — End: 2023-06-01
  Administered 2023-05-26 – 2023-06-01 (×7): 10 mg via ORAL
  Filled 2023-05-26 (×6): qty 2

## 2023-05-26 MED ORDER — SODIUM CHLORIDE 0.9 % INTRAVENOUS SOLUTION
200.0000 mg | Freq: Once | INTRAVENOUS | Status: AC
Start: 2023-05-26 — End: 2023-05-26
  Administered 2023-05-26: 0 mg via INTRAVENOUS
  Administered 2023-05-26: 200 mg via INTRAVENOUS
  Filled 2023-05-26: qty 40

## 2023-05-26 MED ORDER — PANTOPRAZOLE 40 MG TABLET,DELAYED RELEASE
40.0000 mg | DELAYED_RELEASE_TABLET | Freq: Every day | ORAL | Status: DC
Start: 2023-05-26 — End: 2023-06-01
  Administered 2023-05-27 – 2023-06-01 (×6): 40 mg via ORAL
  Filled 2023-05-26 (×7): qty 1

## 2023-05-26 MED ORDER — LOSARTAN 50 MG TABLET
ORAL_TABLET | ORAL | Status: AC
Start: 2023-05-26 — End: 2023-05-26
  Administered 2023-05-26: 50 mg via ORAL
  Filled 2023-05-26: qty 1

## 2023-05-26 MED ORDER — METHYLPREDNISOLONE SOD SUCCINATE 40 MG/ML SOLUTION FOR INJ. WRAPPER
INTRAMUSCULAR | Status: AC
Start: 2023-05-26 — End: 2023-05-27
  Filled 2023-05-26: qty 1

## 2023-05-26 MED ORDER — METHYLPREDNISOLONE SOD SUCCINATE 40 MG/ML SOLUTION FOR INJ. WRAPPER
40.0000 mg | Freq: Four times a day (QID) | INTRAMUSCULAR | Status: DC
Start: 2023-05-26 — End: 2023-05-27
  Administered 2023-05-26 – 2023-05-27 (×4): 40 mg via INTRAVENOUS
  Administered 2023-05-27: 0 mg via INTRAVENOUS
  Filled 2023-05-26 (×3): qty 1

## 2023-05-26 MED ORDER — ATORVASTATIN 40 MG TABLET
80.0000 mg | ORAL_TABLET | Freq: Every evening | ORAL | Status: DC
Start: 2023-05-26 — End: 2023-06-01
  Administered 2023-05-26 – 2023-05-31 (×6): 80 mg via ORAL
  Filled 2023-05-26 (×6): qty 2

## 2023-05-26 MED ORDER — ARFORMOTEROL 15 MCG/2 ML SOLUTION FOR NEBULIZATION
15.0000 ug | INHALATION_SOLUTION | Freq: Two times a day (BID) | RESPIRATORY_TRACT | Status: DC
Start: 2023-05-26 — End: 2023-06-01
  Administered 2023-05-26 – 2023-05-28 (×6): 15 ug via RESPIRATORY_TRACT
  Administered 2023-05-29: 0 ug via RESPIRATORY_TRACT
  Administered 2023-05-29 – 2023-05-31 (×5): 15 ug via RESPIRATORY_TRACT
  Administered 2023-06-01: 0 ug via RESPIRATORY_TRACT
  Filled 2023-05-26 (×2): qty 1

## 2023-05-26 MED ORDER — TORSEMIDE 5 MG TABLET
ORAL_TABLET | ORAL | Status: AC
Start: 2023-05-26 — End: 2023-05-27
  Filled 2023-05-26: qty 2

## 2023-05-26 MED ORDER — DONEPEZIL 10 MG TABLET
10.0000 mg | ORAL_TABLET | Freq: Every evening | ORAL | Status: DC
Start: 2023-05-26 — End: 2023-05-29
  Administered 2023-05-26 – 2023-05-28 (×3): 10 mg via ORAL
  Filled 2023-05-26 (×3): qty 1

## 2023-05-26 MED ORDER — LEVOTHYROXINE 137 MCG TABLET
ORAL_TABLET | ORAL | Status: AC
Start: 2023-05-26 — End: 2023-05-26
  Administered 2023-05-26: 137 ug via ORAL
  Filled 2023-05-26: qty 1

## 2023-05-26 MED ORDER — SODIUM CHLORIDE 0.9 % INTRAVENOUS SOLUTION
100.0000 mg | INTRAVENOUS | Status: DC
Start: 2023-05-27 — End: 2023-05-27
  Filled 2023-05-26 (×2): qty 20

## 2023-05-26 MED ORDER — REVEFENACIN 175 MCG/3 ML SOLUTION FOR NEBULIZATION
175.0000 ug | INHALATION_SOLUTION | Freq: Every day | RESPIRATORY_TRACT | Status: DC
Start: 2023-05-26 — End: 2023-06-01
  Administered 2023-05-26 – 2023-05-28 (×3): 175 ug via RESPIRATORY_TRACT
  Administered 2023-05-29: 0 ug via RESPIRATORY_TRACT
  Administered 2023-05-30 – 2023-05-31 (×2): 175 ug via RESPIRATORY_TRACT
  Administered 2023-06-01: 0 ug via RESPIRATORY_TRACT
  Filled 2023-05-26 (×2): qty 3

## 2023-05-26 MED ORDER — BROMOCRIPTINE 2.5 MG TABLET
2.5000 mg | ORAL_TABLET | Freq: Every day | ORAL | Status: DC
Start: 2023-05-26 — End: 2023-05-29
  Administered 2023-05-26 – 2023-05-28 (×3): 0 mg via ORAL
  Administered 2023-05-29: 2.5 mg via ORAL
  Filled 2023-05-26: qty 1

## 2023-05-26 MED ORDER — LEVOTHYROXINE 137 MCG TABLET
137.0000 ug | ORAL_TABLET | Freq: Every day | ORAL | Status: DC
Start: 2023-05-26 — End: 2023-06-01
  Administered 2023-05-27 – 2023-06-01 (×6): 137 ug via ORAL
  Filled 2023-05-26 (×6): qty 1

## 2023-05-26 MED ORDER — BUDESONIDE 0.5 MG/2 ML SUSPENSION FOR NEBULIZATION
1.0000 mg | INHALATION_SUSPENSION | Freq: Two times a day (BID) | RESPIRATORY_TRACT | Status: DC
Start: 2023-05-26 — End: 2023-06-01
  Administered 2023-05-26 – 2023-05-28 (×6): 1 mg via RESPIRATORY_TRACT
  Administered 2023-05-29: 0 mg via RESPIRATORY_TRACT
  Administered 2023-05-29 – 2023-05-31 (×5): 1 mg via RESPIRATORY_TRACT
  Administered 2023-06-01: 0 mg via RESPIRATORY_TRACT
  Filled 2023-05-26: qty 4

## 2023-05-26 MED ORDER — METOPROLOL SUCCINATE ER 25 MG TABLET,EXTENDED RELEASE 24 HR
25.0000 mg | ORAL_TABLET | Freq: Every day | ORAL | Status: DC
Start: 2023-05-26 — End: 2023-06-01
  Administered 2023-05-26 – 2023-06-01 (×7): 25 mg via ORAL
  Filled 2023-05-26 (×7): qty 1

## 2023-05-26 MED ORDER — TAMSULOSIN 0.4 MG CAPSULE
0.4000 mg | ORAL_CAPSULE | Freq: Every evening | ORAL | Status: DC
Start: 2023-05-26 — End: 2023-06-01
  Administered 2023-05-26 – 2023-05-31 (×6): 0.4 mg via ORAL
  Filled 2023-05-26 (×6): qty 1

## 2023-05-26 NOTE — Nurses Notes (Addendum)
 Pt resting, cleaned up and changed into his gown. Gave medicine. Pt has some productive cough. Pt very weak and not eating anything besides jello. Will continue to monitor pt.    --- contacted family to bring hearing aid charger and cell phone and charger.

## 2023-05-26 NOTE — Nurses Notes (Signed)
 PT wife called i returned her call back. She wanted a update about her husband. Advised that I will call her when the dr makes her rounds and give her a update.

## 2023-05-26 NOTE — Progress Notes (Signed)
  MEDICINE [facility]  IP PROGRESS NOTE      Tanner [name], Tanner [name]  Date of Admission:  05/25/2023  Date of Birth:  11-Oct-1940  Date of Service:  05/26/2023    Hospital Day:  LOS: 0 days     Subjective:   Patient seen examined for follow-up of acute respiratory failure with hypoxia, COVID-19, generalized weakness.    Vital Signs:  Temp (24hrs) Max:38.4 C (101.1 F)      Temperature: 37.8 C (100 F)  BP (Non-Invasive): 132/70  MAP (Non-Invasive): 87 mmHG  Heart Rate: 68  Respiratory Rate: 18  SpO2: 94 %    Current Medications:  acetaminophen (TYLENOL) tablet, 650 mg, Oral, Q4H PRN  aluminum-magnesium hydroxide-simethicone (MAG-AL PLUS) 200-200-20 mg per 5 mL oral liquid, 30 mL, Oral, Q4H PRN  arformoterol (BROVANA) 15 mcg/2 mL nebulizer solution, 15 mcg, Nebulization, 2x/day  atorvastatin (LIPITOR) tablet, 80 mg, Oral, NIGHTLY  bromocriptine (PARLODEL) tablet, 2.5 mg, Oral, Daily  budesonide (PULMICORT RESPULES) 0.5 mg/2 mL nebulizer suspension, 1 mg, Nebulization, 2x/day  dextromethorphan-guaiFENesin (ROBITUSSIN DM) 10-100mg  per 5mL oral liquid, 10 mL, Oral, Q4H PRN  diphenhydrAMINE (BENADRYL) capsule, 25 mg, Oral, HS PRN  donepezil (ARICEPT) tablet, 10 mg, Oral, NIGHTLY  heparin 5,000 unit/mL injection, 5,000 Units, Subcutaneous, Q8HRS  ipratropium-albuterol 0.5 mg-3 mg(2.5 mg base)/3 mL Solution for Nebulization, 3 mL, Nebulization, Q4H PRN  levothyroxine (SYNTHROID) tablet, 137 mcg, Oral, Daily  losartan (COZAAR) tablet, 25 mg, Oral, Daily  methylPREDNISolone (SOLU-medrol) 40 mg/mL injection ---Cabinet Override, , ,   methylPREDNISolone sod succ (SOLU-medrol) 40 mg/mL injection, 40 mg, Intravenous, Q6H  metoprolol succinate (TOPROL-XL) 24 hr extended release tablet, 25 mg, Oral, Daily  NS flush syringe, 3 mL, Intracatheter, Q8HRS  NS flush syringe, 3 mL, Intracatheter, Q1H PRN  NS premix infusion, , Intravenous, Continuous  ondansetron (ZOFRAN) 2 mg/mL injection, 4 mg,  Intravenous, Q6H PRN  pantoprazole (PROTONIX) delayed release tablet, 40 mg, Oral, Daily  remdesivir 200 mg in NS 250 mL (tot vol) IVPB, 200 mg, Intravenous, Once   Followed by  [START ON 05/27/2023] remdesivir 100 mg in NS 250 mL (tot vol) IVPB, 100 mg, Intravenous, Q24H  revefenacin (YUPELRI) 175 mcg/3 mL nebulizer solution, 175 mcg, Nebulization, Daily  tamsulosin (FLOMAX) capsule, 0.4 mg, Oral, Daily after Dinner  torsemide (DEMADEX) 5 mg tablet ---Cabinet Override, , ,   torsemide (DEMADEX) tablet, 10 mg, Oral, Daily        Current Orders:  Active Orders   Lab    CBC/DIFF     Frequency: 0530 - AM DRAW     Number of Occurrences: 1 Occurrences    CBC/DIFF     Frequency: 0530 - AM DRAW     Number of Occurrences: 1 Occurrences    COMPREHENSIVE METABOLIC PANEL, NON-FASTING     Frequency: 0530 - AM DRAW     Number of Occurrences: 1 Occurrences    COMPREHENSIVE METABOLIC PANEL, NON-FASTING     Frequency: 0530 - AM DRAW     Number of Occurrences: 1 Occurrences    MAGNESIUM     Frequency: 0530 - AM DRAW     Number of Occurrences: 1 Occurrences    MAGNESIUM     Frequency: 0530 - AM DRAW     Number of Occurrences: 1 Occurrences    URINALYSIS, MACROSCOPIC     Frequency: Once     Number of Occurrences: 1 Occurrences    URINALYSIS, MACROSCOPIC AND MICROSCOPIC W/CULTURE REFLEX [PRN ONLY]  Frequency: ONE TIME     Number of Occurrences: 1 Occurrences    URINALYSIS, MICROSCOPIC     Frequency: Once     Number of Occurrences: 1 Occurrences   Diet    DIET CARDIAC (2G NA, LOWFAT, LOW CHOL) Do you want to initiate MNT Protocol? Yes     Frequency: All Meals     Number of Occurrences: 1 Occurrences   Nursing    ACTIVITY     Frequency: UNTIL DISCONTINUED     Number of Occurrences: Until Specified    INTAKE AND OUTPUT QSHIFT     Frequency: QSHIFT     Number of Occurrences: Until Specified    MISCELLANEOUS MD/DO TO NURSE     Frequency: UNTIL DISCONTINUED     Number of Occurrences: Until Specified     Order Comments: Please confirm home  medications and let hospitalist know when this is done so that they can be restarted.      Notify MD Vital Signs     Frequency: PRN     Number of Occurrences: Until Specified    NURSE TO ENTER SECONDARY ORDER Other - (specify in comments) (CHEST PAIN AND/OR ARRYTHMIA)     Frequency: UNTIL DISCONTINUED     Number of Occurrences: Until Specified    PT IS HIGH RISK FOR VENOUS THROMBOEMBOLISM     Frequency: CONTINUOUS     Number of Occurrences: Until Specified    PULSE OXIMETRY CONTINUOUS     Frequency: CONTINUOUS     Number of Occurrences: Until Specified    TELEMETRY MONITORING - Continuous     Frequency: CONTINUOUS     Number of Occurrences: Until Specified    VITAL SIGNS  Q4H     Frequency: Q4H     Number of Occurrences: Until Specified   Code Status    FULL CODE: ATTEMPT RESUSCITATION / CPR     Frequency: CONTINUOUS     Number of Occurrences: Until Specified     Order Comments: Patient wishes for full ICU level care including advanced airway interventions / mechanical ventilation.     In the event of pulseless cardiac arrest, patient consents to ACLS (advanced cardiac life support) to attempt resuscitation.  IE - Consents to chest compressions, life support including intubation, mechanical ventilation, defibrillation/cardioversion as indicated.         Consult    IP CONSULT TO CARE MANAGEMENT     Frequency: ONE TIME     Number of Occurrences: 1 Occurrences     Order Comments: Generalized weakness, difficulty ambulating, may need rehab or outpatient servicers     Isolation    ENHANCED DROPLET ISOLATION     Frequency: CONTINUOUS     Number of Occurrences: Until Specified     Order Comments: Private room  Wear a fitted N95/MSA/CAPR when entering room  Wear gown, gloves, and eye protection as indicated  Prior to transport, notify receiving department of precautions  Prior to transport, ensure patient is wearing a mask and follows Respiratory Hygiene/Cough Etiquette         OT    OT EVAL & TREAT     Frequency: Per  Therapist Discretion     Number of Occurrences: 1 Occurrences     Scheduling Instructions:               PT    PT EVALUATE AND TREAT     Frequency: Per Therapist Discretion     Number of Occurrences: 1 Occurrences  Order Comments: If patient's O2 level drops, PT may increase O2 until sats are > 93%.       Scheduling Instructions:               Respiratory Care    ACCUPAP SYSTEM     Frequency: Q4H WHILE AWAKE     Number of Occurrences: Until Specified    OXYGEN - NASAL CANNULA     Frequency: CONTINUOUS     Number of Occurrences: Until Specified     Order Comments: Flowrate should not exeed 6L/min except [facility] facility.     Titrate oxygen to keep sat 90-92%     IV    INSERT & MAINTAIN PERIPHERAL IV ACCESS     Frequency: UNTIL DISCONTINUED     Number of Occurrences: Until Specified    PERIPHERAL IV DRESSING CHANGE     Frequency: PRN     Number of Occurrences: Until Specified   Vascular Ultrasound    PERIPHERAL VENOUS DUPLEX - LOWER     Frequency: ONE TIME     Number of Occurrences: 1 Occurrences     Scheduling Instructions:      Isolation Status:  Enhanced Droplet         Medications    acetaminophen (TYLENOL) tablet     Frequency: Q4H PRN     Dose: 650 mg     Route: Oral    aluminum-magnesium hydroxide-simethicone (MAG-AL PLUS) 200-200-20 mg per 5 mL oral liquid     Frequency: Q4H PRN     Dose: 30 mL     Route: Oral    arformoterol (BROVANA) 15 mcg/2 mL nebulizer solution     Frequency: 2x/day     Dose: 15 mcg     Route: Nebulization    atorvastatin (LIPITOR) tablet     Frequency: NIGHTLY     Dose: 80 mg     Route: Oral    bromocriptine (PARLODEL) tablet     Frequency: Daily     Dose: 2.5 mg     Route: Oral    budesonide (PULMICORT RESPULES) 0.5 mg/2 mL nebulizer suspension     Frequency: 2x/day     Dose: 1 mg     Route: Nebulization    dexAMETHasone 4 mg/mL injection     Frequency: Daily     Dose: 6 mg     Route: IntraMUSCULAR    dextromethorphan-guaiFENesin (ROBITUSSIN DM) 10-100mg  per 5mL oral liquid      Frequency: Q4H PRN     Dose: 10 mL     Route: Oral    diphenhydrAMINE (BENADRYL) capsule     Frequency: HS PRN     Dose: 25 mg     Route: Oral    donepezil (ARICEPT) tablet     Frequency: NIGHTLY     Dose: 10 mg     Route: Oral    heparin 5,000 unit/mL injection     Frequency: Q8HRS     Dose: 5,000 Units     Route: Subcutaneous    ipratropium-albuterol 0.5 mg-3 mg(2.5 mg base)/3 mL Solution for Nebulization     Frequency: Q4H PRN     Dose: 3 mL     Route: Nebulization    levothyroxine (SYNTHROID) tablet     Frequency: Daily     Dose: 137 mcg     Route: Oral    losartan (COZAAR) tablet     Frequency: Daily     Dose: 25 mg     Route: Oral  methylPREDNISolone sod succ (SOLU-medrol) 40 mg/mL injection     Frequency: Q6H     Dose: 40 mg     Route: Intravenous    metoprolol succinate (TOPROL-XL) 24 hr extended release tablet     Frequency: Daily     Dose: 25 mg     Route: Oral    NS flush syringe     Frequency: Q8HRS     Dose: 3 mL     Route: Intracatheter    NS flush syringe     Frequency: Q1H PRN     Dose: 3 mL     Route: Intracatheter    NS premix infusion     Frequency: Continuous     Route: Intravenous    ondansetron (ZOFRAN) 2 mg/mL injection     Frequency: Q6H PRN     Dose: 4 mg     Route: Intravenous    pantoprazole (PROTONIX) delayed release tablet     Frequency: Daily     Dose: 40 mg     Route: Oral    remdesivir 100 mg in NS 250 mL (tot vol) IVPB     Linked Order: Followed by     Frequency: Q24H     Dose: 100 mg     Route: Intravenous    remdesivir 200 mg in NS 250 mL (tot vol) IVPB     Linked Order: Followed by     Frequency: Once     Dose: 200 mg     Route: Intravenous    revefenacin (YUPELRI) 175 mcg/3 mL nebulizer solution     Frequency: Daily     Dose: 175 mcg     Route: Nebulization    tamsulosin (FLOMAX) capsule     Frequency: Daily after Dinner     Dose: 0.4 mg     Route: Oral    torsemide (DEMADEX) tablet     Frequency: Daily     Dose: 10 mg     Route: Oral        Review of Systems:  Focused review  of system was completed. Refer to the HPI for ROS details.     Today's Physical Exam:  Physical Exam  Cardiovascular:      Rate and Rhythm: Normal rate and regular rhythm.   Pulmonary:      Breath sounds: Rhonchi present.   Abdominal:      General: Abdomen is flat.      Palpations: Abdomen is soft.   Skin:     General: Skin is warm and dry.   Neurological:      Mental Status: He is alert and oriented to person, place, and time.   Psychiatric:         Mood and Affect: Mood normal.        I/O:  I/O last 24 hours:    Intake/Output Summary (Last 24 hours) at 05/26/2023 1400  Last data filed at 05/26/2023 1038  Gross per 24 hour   Intake 120 ml   Output 400 ml   Net -280 ml     I/O current shift:  No intake/output data recorded.    Labs  Please indicate ordered or reviewed)  Reviewed: I have reviewed all lab results.    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    Abdominal aortic aneurysm:  Seen on imaging done 06/16/2022-3.5 cm x 3.4 cm    Tanner Mundorf, DO      DVT/PE Prophylaxis: Heparin    Disposition Planning: Home discharge      Advance Care Planning Discussed:  No

## 2023-05-26 NOTE — Care Plan (Signed)
 Patient admitted for weakness and Covid. No s/s of shortness of breath, dyspnea with exertion, and/or discomfort noted. Denies any needs at this time. Receiving NS @75 , urinal at bedside, tele in place. Call light in reach.   Problem: Adult Inpatient Plan of Care  Goal: Plan of Care Review  Reactivated  Goal: Patient-Specific Goal (Individualized)  Reactivated  Flowsheets (Taken 05/26/2023 0138)  Individualized Care Needs: Improve weakness  Anxieties, Fears or Concerns: Dead hearing aid battery  Goal: Absence of Hospital-Acquired Illness or Injury  Reactivated  Intervention: Identify and Manage Fall Risk  Recent Flowsheet Documentation  Taken 05/26/2023 0138 by Dimple Nanas  Safety Promotion/Fall Prevention:   fall prevention program maintained   nonskid shoes/slippers when out of bed   safety round/check completed  Intervention: Prevent Skin Injury  Recent Flowsheet Documentation  Taken 05/26/2023 0138 by Dimple Nanas  Body Position: supine  Skin Protection:   incontinence pads utilized   transparent dressing maintained  Intervention: Prevent and Manage VTE (Venous Thromboembolism) Risk  Recent Flowsheet Documentation  Taken 05/26/2023 0138 by Dimple Nanas  VTE Prevention/Management:   bleeding risk factor(s) identified, physician notified   dorsiflexion/plantar flexion performed  Intervention: Prevent Infection  Recent Flowsheet Documentation  Taken 05/26/2023 0138 by Dimple Nanas  Infection Prevention: rest/sleep promoted  Goal: Optimal Comfort and Wellbeing  Reactivated  Intervention: Provide Person-Centered Care  Recent Flowsheet Documentation  Taken 05/26/2023 0138 by Dimple Nanas  Trust Relationship/Rapport:   care explained   choices provided   questions answered   questions encouraged   reassurance provided  Goal: Rounds/Family Conference  Reactivated     Problem: Health Knowledge, Opportunity to Enhance (Adult,Obstetrics,Pediatric)  Goal: Knowledgeable about Health Subject/Topic  Description: Patient will demonstrate the desired  outcomes by discharge/transition of care.  Reactivated  Intervention: Enhance Health Knowledge  Recent Flowsheet Documentation  Taken 05/26/2023 1610 by Dimple Nanas  Family/Support System Care: self-care encouraged     Problem: Fall Injury Risk  Goal: Absence of Fall and Fall-Related Injury  Reactivated  Intervention: Identify and Manage Contributors  Recent Flowsheet Documentation  Taken 05/26/2023 0138 by Dimple Nanas  Self-Care Promotion: independence encouraged  Medication Review/Management: medications reviewed  Intervention: Promote Injury-Free Environment  Recent Flowsheet Documentation  Taken 05/26/2023 0138 by Dimple Nanas  Safety Promotion/Fall Prevention:   fall prevention program maintained   nonskid shoes/slippers when out of bed   safety round/check completed     Problem: Discharge Needs Assessment  Goal: Discharge Needs Assessment  Reactivated     Problem: COPD (Chronic Obstructive Pulmonary Disease) Comorbidity  Goal: Maintenance of COPD Symptom Control  Reactivated  Intervention: Maintain COPD-Symptom Control  Recent Flowsheet Documentation  Taken 05/26/2023 0138 by Dimple Nanas  Medication Review/Management: medications reviewed     Problem: Hypertension Comorbidity  Goal: Blood Pressure in Desired Range  Reactivated  Intervention: Maintain Blood Pressure Management  Recent Flowsheet Documentation  Taken 05/26/2023 0138 by Dimple Nanas  Medication Review/Management: medications reviewed

## 2023-05-26 NOTE — Nurses Notes (Signed)
 Talked to pts wife about him going to rehab. Pts wife said that she will talk to her family. That more than likely they will send him to rehab cause of the weakness. Asked if it would be close I advised taht the social worker will take care of all that.

## 2023-05-27 DIAGNOSIS — U071 COVID-19: Secondary | ICD-10-CM

## 2023-05-27 DIAGNOSIS — J9601 Acute respiratory failure with hypoxia: Secondary | ICD-10-CM

## 2023-05-27 DIAGNOSIS — I714 Abdominal aortic aneurysm, without rupture, unspecified (CMS HCC): Secondary | ICD-10-CM

## 2023-05-27 DIAGNOSIS — Z8679 Personal history of other diseases of the circulatory system: Secondary | ICD-10-CM

## 2023-05-27 LAB — COMPREHENSIVE METABOLIC PANEL, NON-FASTING
ALBUMIN/GLOBULIN RATIO: 1.2 (ref 0.8–1.4)
ALBUMIN: 3.4 g/dL — ABNORMAL LOW (ref 3.5–5.7)
ALKALINE PHOSPHATASE: 82 U/L (ref 34–104)
ALT (SGPT): 12 U/L (ref 7–52)
ANION GAP: 10 mmol/L (ref 4–13)
AST (SGOT): 21 U/L (ref 13–39)
BILIRUBIN TOTAL: 0.7 mg/dL (ref 0.3–1.0)
BUN/CREA RATIO: 21 (ref 6–22)
BUN: 18 mg/dL (ref 7–25)
CALCIUM, CORRECTED: 9.1 mg/dL (ref 8.9–10.8)
CALCIUM: 8.6 mg/dL (ref 8.6–10.3)
CHLORIDE: 107 mmol/L (ref 98–107)
CO2 TOTAL: 22 mmol/L (ref 21–31)
CREATININE: 0.87 mg/dL (ref 0.60–1.30)
ESTIMATED GFR: 85 mL/min/{1.73_m2} (ref >59)
GLOBULIN: 2.8 (ref 2.0–3.5)
GLUCOSE: 160 mg/dL — ABNORMAL HIGH (ref 74–109)
OSMOLALITY, CALCULATED: 283 mosm/kg (ref 270–290)
POTASSIUM: 3.5 mmol/L (ref 3.5–5.1)
PROTEIN TOTAL: 6.2 g/dL — ABNORMAL LOW (ref 6.4–8.9)
SODIUM: 139 mmol/L (ref 136–145)

## 2023-05-27 LAB — CBC WITH DIFF
BASOPHIL #: 0 10*3/uL (ref 0.00–0.10)
BASOPHIL %: 0 % (ref 0–1)
EOSINOPHIL #: 0 10*3/uL (ref 0.00–0.50)
EOSINOPHIL %: 0 % — ABNORMAL LOW (ref 1–8)
HCT: 37.5 % (ref 36.7–47.1)
HGB: 12.9 g/dL (ref 12.5–16.3)
LYMPHOCYTE #: 0.5 10*3/uL — ABNORMAL LOW (ref 1.00–3.20)
LYMPHOCYTE %: 7 % — ABNORMAL LOW (ref 15–43)
MCH: 31.1 pg (ref 23.8–33.4)
MCHC: 34.3 g/dL (ref 32.5–36.3)
MCV: 90.7 fL (ref 73.0–96.2)
MONOCYTE #: 0.1 10*3/uL — ABNORMAL LOW (ref 0.30–1.10)
MONOCYTE %: 2 % — ABNORMAL LOW (ref 6–14)
MPV: 8 fL (ref 7.4–11.4)
NEUTROPHIL #: 5.8 10*3/uL (ref 1.70–7.60)
NEUTROPHIL %: 91 % — ABNORMAL HIGH (ref 44–74)
PLATELETS: 154 10*3/uL (ref 140–440)
RBC: 4.14 10*6/uL (ref 4.06–5.63)
RDW: 14.7 % (ref 12.1–16.2)
WBC: 6.4 10*3/uL (ref 3.6–10.2)

## 2023-05-27 LAB — SCAN DIFFERENTIAL: PLATELET MORPHOLOGY COMMENT: NORMAL

## 2023-05-27 LAB — MAGNESIUM: MAGNESIUM: 2.1 mg/dL (ref 1.9–2.7)

## 2023-05-27 MED ORDER — METHYLPREDNISOLONE SOD SUCCINATE 40 MG/ML SOLUTION FOR INJ. WRAPPER
20.0000 mg | Freq: Four times a day (QID) | INTRAMUSCULAR | Status: DC
Start: 2023-05-27 — End: 2023-05-28
  Administered 2023-05-27 – 2023-05-28 (×3): 20 mg via INTRAVENOUS
  Filled 2023-05-27 (×3): qty 1

## 2023-05-27 NOTE — Care Plan (Signed)
 Pt is A & O x4. Pt is on 2 L NC. Pt up with one assist. Fall prevention protocol maintained. Bed alarm set with non-slip socks on and call light in reach. Education is ongoing. Discharge planning is in place.   Problem: Adult Inpatient Plan of Care  Goal: Plan of Care Review  Outcome: Ongoing (see interventions/notes)  Goal: Patient-Specific Goal (Individualized)  Outcome: Ongoing (see interventions/notes)  Goal: Absence of Hospital-Acquired Illness or Injury  Outcome: Ongoing (see interventions/notes)  Goal: Optimal Comfort and Wellbeing  Outcome: Ongoing (see interventions/notes)  Goal: Rounds/Family Conference  Outcome: Ongoing (see interventions/notes)     Problem: Health Knowledge, Opportunity to Enhance (Adult,Obstetrics,Pediatric)  Goal: Knowledgeable about Health Subject/Topic  Description: Patient will demonstrate the desired outcomes by discharge/transition of care.  Outcome: Ongoing (see interventions/notes)     Problem: Fall Injury Risk  Goal: Absence of Fall and Fall-Related Injury  Outcome: Ongoing (see interventions/notes)     Problem: Discharge Needs Assessment  Goal: Discharge Needs Assessment  Outcome: Ongoing (see interventions/notes)     Problem: COPD (Chronic Obstructive Pulmonary Disease) Comorbidity  Goal: Maintenance of COPD Symptom Control  Outcome: Ongoing (see interventions/notes)     Problem: Hypertension Comorbidity  Goal: Blood Pressure in Desired Range  Outcome: Ongoing (see interventions/notes)

## 2023-05-27 NOTE — Nurses Notes (Signed)
 [id number]- This nurse agrees with all assessments performed by Kerrin Mo, Nurse Intern.

## 2023-05-27 NOTE — OT Evaluation (Signed)
 [facility]  [address]  [place], [address]  [phone]  (Fax) [phone]  Rehabilitation Services  Occupational Therapy Inpatient Initial Evaluation      Patient Name: Tanner [name]  Date of Birth: 04/12/1940  Height: Height: 182.9 cm (6' 0.01")  Weight: Weight: 91.4 kg (201 lb 9.6 oz)  Room/Bed: 308/A  Payor: [facility] / Plan: [facility] [employer] / Product Type: Managed Care /         PMH:   Past Medical History:   Diagnosis Date    Ataxia     Congestive heart failure (CMS HCC)     Coronary artery disease     Depression     Disorder of thyroid     Esophageal reflux     Heart disease     Hx of thyroid cancer     Hypercholesterolemia     Hyperlipidemia     Hypertension     Lung cancer (CMS HCC)     Meniere disease     Pacemaker     Pituitary tumor            Assessment:      Functional Level at Time of Session: (P) Patient is a pleasant 83 year old male admitted for generalized weakness and COVID19. Prior to admission, he was independent in ADLs and used a FWW for functional mobility. During OT eval, patient was alert, oriented x4, cooperative and able to follow multistep commands. He has full UB AROM and good (4/5) UB strength. He required SBA with LB dressing and CGA to transfer from sit-stand-sit due to impaired balance and SOB. OT educated patient on PLB and he was able to return demo successfully. Patient will benefit from post-acute rehab upon discharge. OT to follow up during acute stay for ADL training, endurance training and functional transfer training.    Discharge Needs:   Equipment Recommendation:  TBD    The patient presents with mobility limitations due to impaired strength and impaired functional activity tolerance that significantly impair/prevent patient's ability to participate in mobility-related activities of daily living (MRADLs) including  ambulation and transfers in order to safely complete, toileting, bathing, safely  entering/exiting the home, in reasonable time. This functional mobility deficit can be sufficiently resolved with the use of a Anticipated Equipment Needs at Discharge: (P) to be determined  in order to decrease the risk of falls, morbidity, and mortality in performance of these MRADLs.  Patient is able to safely use this assistive device.    Discharge Disposition:  inpatient rehabilitation facility, skilled nursing facility    JUSTIFICATION OF DISCHARGE RECOMMENDATION   Based on current diagnosis, functional performance prior to admission, and current functional performance, this patient requires continued OT services in Anticipated Discharge Disposition: (P) inpatient rehabilitation facility, skilled nursing facility  in order to achieve significant functional improvements.    Plan:   Current Intervention:  Predicted Duration of Therapy: (P) until discharge    To provide Occupational therapy services  Therapy Frequency: (P) minimum of 1x/week for duration of Predicted Duration of Therapy: (P) until discharge  .       The risks/benefits of therapy have been discussed with the patient/caregiver and he/she is in agreement with the established plan of care.       Subjective & Objective     MEDICAL HISTORY:   Past Medical History:   Diagnosis Date    Ataxia     Congestive heart failure (CMS HCC)  Coronary artery disease     Depression     Disorder of thyroid     Esophageal reflux     Heart disease     Hx of thyroid cancer     Hypercholesterolemia     Hyperlipidemia     Hypertension     Lung cancer (CMS HCC)     Meniere disease     Pacemaker     Pituitary tumor          SURGICAL HISTORY:   Past Surgical History:   Procedure Laterality Date    CARDIAC PACEMAKER PLACEMENT  10/29/2021    DUAL CHAMBER PACEMAKER INSERTION WITH LEADS    HX APPENDECTOMY      HX CATARACT REMOVAL Bilateral     HX CORONARY ARTERY BYPASS GRAFT      HX HEART SURGERY      open heart x2    HX MITRAL VALVE REPAIR      HX THYROIDECTOMY      LUNG  REMOVAL, PARTIAL Right        ipp    INSERT FLOW SHEET     05/27/23 1048   Rehab Session   Document Type evaluation   OT Visit Date 05/27/23   Total OT Minutes: 9   Patient Effort good   Symptoms Noted During/After Treatment fatigue;shortness of breath   General Information   Patient Profile Reviewed yes   Medical Lines PIV Line;Telemetry   Respiratory Status room air   Existing Precautions/Restrictions droplet isolation;hard of hearing   Pre Treatment Status   Pre Treatment Patient Status Patient sitting in bedside chair or w/c;Call light within reach;Telephone within reach;Patient safety alarm activated;Nurse approved session   Support Present Pre Treatment  Clinical assistant present   Communication Pre Treatment  Charge Nurse   Communication Pre Treatment Comment Cleared for OT   Mutuality/Individual Preferences   Anxieties, Fears or Concerns Wants to d/c to [facility]   Individualized Care Needs Assistance with ADLs, monitoring vitals   Patient-Specific Goals (Include Timeframe) D/C to rehab   Plan of Care Reviewed With patient   Living Environment   Lives With spouse   Living Arrangements house   Home Accessibility stairs to enter home   Living Environment Comment He has a walk-in shower, shower chair and BSC   Home Main Entrance   Number of Stairs, Main Entrance five   Functional Level Prior   Ambulation 1 - assistive equipment  (FWW)   Transferring 0 - independent   Toileting 0 - independent   Bathing 1 - assistive equipment  (shower chair)   Dressing 0 - independent   Eating 0 - independent   Communication 0 - understands/communicates without difficulty   Swallowing 0-->swallows foods/liquids without difficulty   Self-Care   Dominant Hand right   Vital Signs   Pre-Treatment Heart Rate (beats/min) 73   Post-treatment Heart Rate (beats/min) 77   Pre SpO2 (%) 98   O2 Delivery Pre Treatment room air   Post SpO2 (%) 94   O2 Delivery Post Treatment room air   Coping/Psychosocial   Observed Emotional State  calm;cooperative;pleasant   Verbalized Emotional State acceptance   Cognition   Behavior/Mood Observations behavior appropriate to situation, WNL/WFL   Orientation Status oriented x 4   Attention WNL/WFL   Follows Commands WFL   Vision Assessment/Interventions   Visual Impairment/Limitations WFL with corrective lenses   RUE Assessment   RUE Assessment WFL- Within Functional Limits   LUE Assessment   LUE Assessment  WFL- Within Functional Limits   Grip Strength   Grip Left (4/5) good, left   Right Grip (4/5) good, right   Transfer Assessment/Treatment   Sit-Stand Independence contact guard assist   Stand-Sit Independence contact guard assist   Transfer Impairments balance impaired;strength decreased   Lower Body Dressing Assessment/Training   Position sitting   DRESSING ASSESSED Don Socks;Doff Socks   Independence Level  standby assist   Post Treatment Status   Post Treatment Patient Status Patient sitting in bedside chair or w/c;Call light within reach;Patient safety alarm activated;Telephone within reach   Support Present Post Treatment  None   Care Plan Goals   OT Rehab Goals Bathing Goal;Grooming Goal;LB Dressing Goal;Toileting Goal;UB Dressing Goal   Bathing Goal   Bathing Goal, Date Established 05/27/23   Bathing Goal, Time to Achieve by discharge   Bathing Goal, Activity Type all bathing tasks   Bathing Goal, Independence Level modified independence   Bathing Goal, Adaptive Equipment shower chair   Grooming Goal   Grooming Goal, Date Established 05/27/23   Grooming Goal, Time to Achieve by discharge   Grooming Goal, Activity Type all grooming tasks   Grooming Goal, Independence  independent   LB Dressing Goal   LB Dressing Goal, Date Established 05/27/23   LB Dressing Goal, Time to Achieve by discharge   LB Dressing Goal, Activity Type all lower body dressing tasks   LB Dressing Goal, Independence Level independent   Toileting Goal   Toileting Goal, Date Established 05/27/23   Toileting Goal, Time to Achieve by  discharge   Toileting Goal, Activity Type all toileting tasks   Toileting Goal, Independence Level independent   UB Dressing Goal   UB Dressing  Goal, Date Established 05/27/23   UB Dressing Goal, Time to Achieve by discharge   UB Dressing Goal, Activity Type all upper body dressing tasks   UB Dressing Goal, Independence Level independent   Planned Therapy Interventions, OT Eval   Planned Therapy Interventions ADL retraining;strengthening;endurance training   Clinical Impression   Functional Level at Time of Session Patient is a pleasant 83 year old male admitted for generalized weakness and COVID19. Prior to admission, he was independent in ADLs and used a FWW for functional mobility. During OT eval, patient was alert, oriented x4, cooperative and able to follow multistep commands. He has full UB AROM and good (4/5) UB strength. He required SBA with LB dressing and CGA to transfer from sit-stand-sit due to impaired balance and SOB. OT educated patient on PLB and he was able to return demo successfully. Patient will benefit from post-acute rehab upon discharge. OT to follow up during acute stay for ADL training, endurance training and functional transfer training.   Criteria for Skilled Therapeutic Interventions Met (OT) yes;meets criteria;skilled treatment is necessary   Rehab Potential good   Therapy Frequency minimum of 1x/week   Predicted Duration of Therapy until discharge   Anticipated Equipment Needs at Discharge to be determined   Anticipated Discharge Disposition inpatient rehabilitation facility;skilled nursing facility   Evaluation Complexity Justification   Occupational Profile Review Brief history   Performance Deficits 1-3 deficits   Clinical Decision Making Low analytic complexity   Evaluation Complexity Low       TREATMENT PLAN: THERAPEUTIC ACTIVITIES and ADL/IADL TRAINING  EVALUATION COMPLEXITY: CLINICAL DECISION MAKING OF LOW COMPLEXITY AS INDICATED BY PMH, OCCUPATIONAL THERAPY ASSESSMENT OF  MUSCULOSKELETAL AND NEUROLOGICAL SYSTEMS AND ACTIVITY LIMITATIONS. CLINICAL PRESENTATION IS STABLE AND UNCOMPLICATED.      EVALUATION 9  minutes    Therapist:      Hildred Laser, OT,05/27/2023 11:14

## 2023-05-27 NOTE — Progress Notes (Signed)
 [place] [facility]  [facility]  IP PROGRESS NOTE      Tanner [name], Tanner [name]  Date of Admission:  05/25/2023  Date of Birth:  [birth year]  Date of Service:  05/27/2023    Hospital Day:  LOS: 0 days     Subjective:   Patient seen examined for follow-up of acute respiratory failure with hypoxia, COVID-19, generalized weakness.    2/28:  reports improvement since yesterday, on room air with oxygen sat in 90's.    Vital Signs:  Temp (24hrs) Max:37 C (98.6 F)      Temperature: 36.5 C (97.7 F)  BP (Non-Invasive): (!) 140/80  MAP (Non-Invasive): 101 mmHG  Heart Rate: 65  Respiratory Rate: 18  SpO2: 98 %    Current Medications:  acetaminophen (TYLENOL) tablet, 650 mg, Oral, Q4H PRN  aluminum-magnesium hydroxide-simethicone (MAG-AL PLUS) 200-200-20 mg per 5 mL oral liquid, 30 mL, Oral, Q4H PRN  arformoterol (BROVANA) 15 mcg/2 mL nebulizer solution, 15 mcg, Nebulization, 2x/day  atorvastatin (LIPITOR) tablet, 80 mg, Oral, NIGHTLY  bromocriptine (PARLODEL) tablet, 2.5 mg, Oral, Daily  budesonide (PULMICORT RESPULES) 0.5 mg/2 mL nebulizer suspension, 1 mg, Nebulization, 2x/day  dextromethorphan-guaiFENesin (ROBITUSSIN DM) 10-100mg  per 5mL oral liquid, 10 mL, Oral, Q4H PRN  diphenhydrAMINE (BENADRYL) capsule, 25 mg, Oral, HS PRN  donepezil (ARICEPT) tablet, 10 mg, Oral, NIGHTLY  heparin 5,000 unit/mL injection, 5,000 Units, Subcutaneous, Q8HRS  ipratropium-albuterol 0.5 mg-3 mg(2.5 mg base)/3 mL Solution for Nebulization, 3 mL, Nebulization, Q4H PRN  levothyroxine (SYNTHROID) tablet, 137 mcg, Oral, Daily  losartan (COZAAR) tablet, 25 mg, Oral, Daily  methylPREDNISolone sod succ (SOLU-medrol) 40 mg/mL injection, 40 mg, Intravenous, Q6H  metoprolol succinate (TOPROL-XL) 24 hr extended release tablet, 25 mg, Oral, Daily  NS flush syringe, 3 mL, Intracatheter, Q8HRS  NS flush syringe, 3 mL, Intracatheter, Q1H PRN  NS premix infusion, , Intravenous, Continuous  ondansetron (ZOFRAN) 2 mg/mL  injection, 4 mg, Intravenous, Q6H PRN  pantoprazole (PROTONIX) delayed release tablet, 40 mg, Oral, Daily  revefenacin (YUPELRI) 175 mcg/3 mL nebulizer solution, 175 mcg, Nebulization, Daily  tamsulosin (FLOMAX) capsule, 0.4 mg, Oral, Daily after Dinner  torsemide (DEMADEX) tablet, 10 mg, Oral, Daily        Current Orders:  Active Orders   Lab    CBC/DIFF     Frequency: 0530 - AM DRAW     Number of Occurrences: 1 Occurrences    COMPREHENSIVE METABOLIC PANEL, NON-FASTING     Frequency: 0530 - AM DRAW     Number of Occurrences: 1 Occurrences    MAGNESIUM     Frequency: 0530 - AM DRAW     Number of Occurrences: 1 Occurrences   Diet    DIET CARDIAC (2G NA, LOWFAT, LOW CHOL) Do you want to initiate MNT Protocol? Yes     Frequency: All Meals     Number of Occurrences: 1 Occurrences   Nursing    ACTIVITY     Frequency: UNTIL DISCONTINUED     Number of Occurrences: Until Specified    INTAKE AND OUTPUT QSHIFT     Frequency: QSHIFT     Number of Occurrences: Until Specified    MISCELLANEOUS MD/DO TO NURSE     Frequency: UNTIL DISCONTINUED     Number of Occurrences: Until Specified     Order Comments: Please confirm home medications and let hospitalist know when this is done so that they can be restarted.      Notify MD Vital Signs  Frequency: PRN     Number of Occurrences: Until Specified    NURSE TO ENTER SECONDARY ORDER Other - (specify in comments) (CHEST PAIN AND/OR ARRYTHMIA)     Frequency: UNTIL DISCONTINUED     Number of Occurrences: Until Specified    PT IS HIGH RISK FOR VENOUS THROMBOEMBOLISM     Frequency: CONTINUOUS     Number of Occurrences: Until Specified    PULSE OXIMETRY CONTINUOUS     Frequency: CONTINUOUS     Number of Occurrences: Until Specified    TELEMETRY MONITORING - Continuous     Frequency: CONTINUOUS     Number of Occurrences: Until Specified    VITAL SIGNS  Q4H     Frequency: Q4H     Number of Occurrences: Until Specified   Code Status    FULL CODE: ATTEMPT RESUSCITATION / CPR     Frequency:  CONTINUOUS     Number of Occurrences: Until Specified     Order Comments: Patient wishes for full ICU level care including advanced airway interventions / mechanical ventilation.     In the event of pulseless cardiac arrest, patient consents to ACLS (advanced cardiac life support) to attempt resuscitation.  IE - Consents to chest compressions, life support including intubation, mechanical ventilation, defibrillation/cardioversion as indicated.         Consult    IP CONSULT TO CARE MANAGEMENT     Frequency: ONE TIME     Number of Occurrences: 1 Occurrences     Order Comments: Generalized weakness, difficulty ambulating, may need rehab or outpatient servicers     Isolation    ENHANCED DROPLET ISOLATION     Frequency: CONTINUOUS     Number of Occurrences: Until Specified     Order Comments: Private room  Wear a fitted N95/MSA/CAPR when entering room  Wear gown, gloves, and eye protection as indicated  Prior to transport, notify receiving department of precautions  Prior to transport, ensure patient is wearing a mask and follows Respiratory Hygiene/Cough Etiquette         OT    OT EVAL & TREAT     Frequency: Per Therapist Discretion     Number of Occurrences: 1 Occurrences     Scheduling Instructions:               PT    PT EVALUATE AND TREAT     Frequency: Per Therapist Discretion     Number of Occurrences: 1 Occurrences     Order Comments: If patient's O2 level drops, PT may increase O2 until sats are > 93%.       Scheduling Instructions:               Respiratory Care    ACCUPAP SYSTEM     Frequency: Q4H WHILE AWAKE     Number of Occurrences: Until Specified    OXYGEN - NASAL CANNULA     Frequency: CONTINUOUS     Number of Occurrences: Until Specified     Order Comments: Flowrate should not exeed 6L/min except [facility] facility.     Titrate oxygen to keep sat 90-92%     IV    INSERT & MAINTAIN PERIPHERAL IV ACCESS     Frequency: UNTIL DISCONTINUED     Number of Occurrences: Until Specified    PERIPHERAL IV DRESSING CHANGE      Frequency: PRN     Number of Occurrences: Until Specified   Medications    acetaminophen (TYLENOL) tablet  Frequency: Q4H PRN     Dose: 650 mg     Route: Oral    aluminum-magnesium hydroxide-simethicone (MAG-AL PLUS) 200-200-20 mg per 5 mL oral liquid     Frequency: Q4H PRN     Dose: 30 mL     Route: Oral    arformoterol (BROVANA) 15 mcg/2 mL nebulizer solution     Frequency: 2x/day     Dose: 15 mcg     Route: Nebulization    atorvastatin (LIPITOR) tablet     Frequency: NIGHTLY     Dose: 80 mg     Route: Oral    bromocriptine (PARLODEL) tablet     Frequency: Daily     Dose: 2.5 mg     Route: Oral    budesonide (PULMICORT RESPULES) 0.5 mg/2 mL nebulizer suspension     Frequency: 2x/day     Dose: 1 mg     Route: Nebulization    dexAMETHasone 4 mg/mL injection     Frequency: Daily     Dose: 6 mg     Route: IntraMUSCULAR    dextromethorphan-guaiFENesin (ROBITUSSIN DM) 10-100mg  per 5mL oral liquid     Frequency: Q4H PRN     Dose: 10 mL     Route: Oral    diphenhydrAMINE (BENADRYL) capsule     Frequency: HS PRN     Dose: 25 mg     Route: Oral    donepezil (ARICEPT) tablet     Frequency: NIGHTLY     Dose: 10 mg     Route: Oral    heparin 5,000 unit/mL injection     Frequency: Q8HRS     Dose: 5,000 Units     Route: Subcutaneous    ipratropium-albuterol 0.5 mg-3 mg(2.5 mg base)/3 mL Solution for Nebulization     Frequency: Q4H PRN     Dose: 3 mL     Route: Nebulization    levothyroxine (SYNTHROID) tablet     Frequency: Daily     Dose: 137 mcg     Route: Oral    losartan (COZAAR) tablet     Frequency: Daily     Dose: 25 mg     Route: Oral    methylPREDNISolone sod succ (SOLU-medrol) 40 mg/mL injection     Frequency: Q6H     Dose: 40 mg     Route: Intravenous    metoprolol succinate (TOPROL-XL) 24 hr extended release tablet     Frequency: Daily     Dose: 25 mg     Route: Oral    NS flush syringe     Frequency: Q8HRS     Dose: 3 mL     Route: Intracatheter    NS flush syringe     Frequency: Q1H PRN     Dose: 3 mL      Route: Intracatheter    NS premix infusion     Frequency: Continuous     Route: Intravenous    ondansetron (ZOFRAN) 2 mg/mL injection     Frequency: Q6H PRN     Dose: 4 mg     Route: Intravenous    pantoprazole (PROTONIX) delayed release tablet     Frequency: Daily     Dose: 40 mg     Route: Oral    revefenacin (YUPELRI) 175 mcg/3 mL nebulizer solution     Frequency: Daily     Dose: 175 mcg     Route: Nebulization    tamsulosin (FLOMAX) capsule     Frequency:  Daily after Dinner     Dose: 0.4 mg     Route: Oral    torsemide (DEMADEX) tablet     Frequency: Daily     Dose: 10 mg     Route: Oral        Review of Systems:  Focused review of system was completed. Refer to the HPI for ROS details.     Today's Physical Exam:  Physical Exam  Cardiovascular:      Rate and Rhythm: Normal rate and regular rhythm.   Pulmonary:      Breath sounds: Rhonchi present.   Abdominal:      General: Abdomen is flat.      Palpations: Abdomen is soft.   Skin:     General: Skin is warm and dry.   Neurological:      Mental Status: He is alert and oriented to person, place, and time.   Psychiatric:         Mood and Affect: Mood normal.        I/O:  I/O last 24 hours:    Intake/Output Summary (Last 24 hours) at 05/27/2023 1352  Last data filed at 05/27/2023 1200  Gross per 24 hour   Intake 3100 ml   Output 710 ml   Net 2390 ml     I/O current shift:  02/28 0700 - 02/28 1859  In: 240 [P.O.:240]  Out: 100 [Urine:100]    Labs  Please indicate ordered or reviewed)  Reviewed: I have reviewed all lab results.    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    Abdominal aortic aneurysm:  Seen on imaging done 06/16/2022-3.5 cm x 3.4 cm    Placement on DC    Tanner Chaikin, DO      DVT/PE Prophylaxis: Heparin    Disposition Planning: Home discharge      Advance Care Planning Discussed:  No

## 2023-05-27 NOTE — Care Management Notes (Addendum)
 Admitted from home.  Dx: Resp Failure with Hypoxia, Covid 19, Generalized Weakness.   H/O CAD with S/P Bypass 2022, Lung Cancer, CHF, Heart Disease.     CM contacted wife this am to ascertain patient's pre-morbid status.  Shane Crutch tel: [phone].  Wife advised patient lives at physical address [address]/[place] with her.  Wife reported an adult son living next door and another living in [place].  Wife reported patient is ambulatory with use of Walker or Cane.    No other DME in home other than [employer].  Wife reported patient has experienced  physical decline X 2 weeks secondary to "not feeling well". Wife further stated patient was "having trouble" walking and "getting around".   Prior to, patient was able to manage his self care without assistance.    Wife reported patient does not use supplemental Oxygen Therapy and denied Home Health/Formal Supports.   Patient receives his primary healthcare with Dr. Rolly Pancake at [facility]   Rx medications are provided by [facility].   Patient is 100% Service-Connected per wife.   Other than Rx and Primary healthcare, patient does not receive any other services from [facility] at this time.   Wife advised she has provided transport for patient, via car, X 1-2 months secondary to patient being "weak"  and "unable".   Wife is requesting placement in IPR for strengthening/conditioning on discharge.   CM explained process for IPR placement to which wife voiced understanding.    CM requested escalation of OT/PT Evals as [facility] will request this info when referral is made for IPR.    Clinical Team notified of need for PT and OT Evals by 1:00 PM today to be considered for IPR placement.   Once received, CM will forward clinical info, via fax, to [facility] requesting IPR placement.     1:14 PM  Received call back from [facility] to notify of receipt of clinical info for short term rehab request.  Awaiting bed status.     1:30 PM  Received call back from Lupita Leash at  [facility] notifying of denial of admission secondary to no bed availability.  Can refer to [facility].   Clinical info uploaded to [facility] and sent to [facility].

## 2023-05-27 NOTE — PT Evaluation (Signed)
 [facility]  [address]  [place], [address]  [phone]  (Fax) [phone]  Rehabilitation Services  Physical Therapy Inpatient Initial Evaluation    Patient Name: Tanner [name]  Date of Birth: May 21, 1940  Height: Height: 182.9 cm (6' 0.01")  Weight: Weight: 91.4 kg (201 lb 9.6 oz)  Room/Bed: 308/A  Payor: [facility] [employer] [employer] / Plan: [employer] / Product Type: Managed Care /       PMH:  Past Medical History:   Diagnosis Date    Ataxia     Congestive heart failure (CMS HCC)     Coronary artery disease     Depression     Disorder of thyroid     Esophageal reflux     Heart disease     Hx of thyroid cancer     Hypercholesterolemia     Hyperlipidemia     Hypertension     Lung cancer (CMS HCC)     Meniere disease     Pacemaker     Pituitary tumor            Assessment:      (P) Patient was admitted with Covid, generalized weakness and ataxia. He participated well in PT evaluation with good effort. He completed sit to stand transfer in a quick movment using his arms on chair to propel him up off the chair and grabbed walker to steady himself as he fully found his COG and adjusted his  footing. He ambulated 3 times to the door and back due to isolation with FWW and Min/CGA and min cues for positioning of walker and foot placement. He has significant sensory loss and numbness in both feet and lower legs which affects his gait quality and increases his risk for falls. With ambulation his legs began to fatigue so he returned to sit in chair transferring stand to sit with CGA. He will benefit from continued PT services throughout remainder of hospital stay and following discharge at inpatient rehab facility prior to returning to home.    Total Distance Ambulated: (P) 60 feet  Independence: (P) minimum assist (75% patient effort), contact guard assist  Assistive Device: (P) walker, front wheeled      Discharge Needs:    Equipment Recommendation: (P) front wheeled walker,  wheelchair      The patient presents with mobility limitations due to impaired balance, impaired strength, impaired functional activity tolerance, and sensory loss BLE  that significantly impair/prevent patient's ability to participate in mobility-related activities of daily living (MRADLs) including  ambulation and transfers in order to safely complete, toileting, bathing, safely entering/exiting the home. This functional mobility deficit can be improved with the use of a (P) front wheeled walker, wheelchair  in order to decrease the risk of falls in performance of these MRADLs.  Patient is able to safely use this assistive device.    Discharge Disposition: (P) inpatient rehabilitation facility    JUSTIFICATION OF DISCHARGE RECOMMENDATION   Based on current diagnosis, functional performance prior to admission, and current functional performance, this patient requires continued PT services in (P) inpatient rehabilitation facility in order to achieve significant functional improvements in these deficit areas: (P) aerobic capacity/endurance, gait, locomotion, and balance, muscle performance, sensory integrity.        Plan:   Current Intervention: (P) balance training, bed mobility training, gait training, home exercise program, patient/family education, strengthening, transfer training, other (see comments) (sensory reintegration techniques)  To provide physical therapy services (P) other (  see comments) (1-2x/day at least one day weekly)  for duration of (P) until discharge.    The risks/benefits of therapy have been discussed with the patient/caregiver and he/she is in agreement with the established plan of care.       Subjective & Objective     Past Medical History:   Diagnosis Date    Ataxia     Congestive heart failure (CMS HCC)     Coronary artery disease     Depression     Disorder of thyroid     Esophageal reflux     Heart disease     Hx of thyroid cancer     Hypercholesterolemia     Hyperlipidemia      Hypertension     Lung cancer (CMS HCC)     Meniere disease     Pacemaker     Pituitary tumor             Past Surgical History:   Procedure Laterality Date    CARDIAC PACEMAKER PLACEMENT  10/29/2021    DUAL CHAMBER PACEMAKER INSERTION WITH LEADS    HX APPENDECTOMY      HX CATARACT REMOVAL Bilateral     HX CORONARY ARTERY BYPASS GRAFT      HX HEART SURGERY      open heart x2    HX MITRAL VALVE REPAIR      HX THYROIDECTOMY      LUNG REMOVAL, PARTIAL Right         05/27/23 1114   Rehab Session   Document Type evaluation   PT Visit Date 05/27/23   General Information   Patient Profile Reviewed yes   Pertinent History of Current Functional Problem Patient to ED on 2/256/25 with fever, chills, body aches, SOB, NP cough, weakness and increased difficulty walking x2 weeks. He was admitted with Covid, generalized weakness, and ataxia. Order received for PT evaluation.   Medical Lines PIV Line;Telemetry   Respiratory Status room air   Existing Precautions/Restrictions droplet isolation;fall precautions;hard of hearing;full code   Mutuality/Individual Preferences   Anxieties, Fears or Concerns Concerned about his legs and unsteadiness   Individualized Care Needs Physical Therapy   Living Environment   Lives With spouse  (son lives next door and other son lives 3 miles away from patient)   Living Arrangements house  (single story)   Home Accessibility stairs to enter home   Home Main Entrance   Number of Stairs, Main Entrance five   Stair Railings, Main Entrance railings on both sides of stairs  (states he uses a PUW also)   Functional Level Prior   Ambulation 1 - assistive equipment  (FWW in the house and a PUW outside. His son is planning to build a ramp into the back of the house that connects to his workshop so he can use an Art gallery manager which  he has to get from his house to the workshop.)   Transferring 0 - independent   Toileting 0 - independent   Bathing 1 - assistive equipment   Dressing 0 - independent   Eating 0  - independent   Communication 0 - understands/communicates without difficulty   Pre Treatment Status   Pre Treatment Patient Status Patient sitting in bedside chair or w/c   Support Present Pre Treatment  None   Communication Pre Treatment  Charge Nurse   Communication Pre Treatment Comment cleared for PT eval   Cognitive Assessment/Interventions   Behavior/Mood Observations behavior appropriate to situation, WNL/WFL;alert;cooperative  Orientation Status oriented x 4   Attention WNL/WFL   Follows Commands WNL   Pre- Treatment Vital Signs   Pre-Treatment Heart Rate (beats/min) 68   Pre SpO2 (%) 96   O2 Delivery Pre Treatment room air   Pre-Treatment Pain   Pretreatment Pain Rating 0/10 - no pain   RUE Assessment   RUE Assessment WFL- Within Functional Limits   LUE Assessment   LUE Assessment WFL- Within Functional Limits   RLE Assessment   RLE Assessment X-Exceptions   RLE ROM   (WNL AROM)   RLE Strength 4+/5 to 5/5   RLE Sensation other (comment)  (significant sensory loss in both feet and lower legs)   LLE Assessment   LLE Assessment X-Exceptions   LLE ROM   (WNL AROM)   LLE Strength 4+/5 to 5/5   LLE Sensation   (significant sensory loss in both feet and lower legs)   Trunk Assessment   Trunk Assessment WFL-Within Functional Limits   Mobility Assessment/Training   Additional Documentation Transfer Assessment/Treatment (Group);Gait Assessment/Treatment (Group)   Transfer Assessment/Treatment   Sit-Stand Independence contact guard assist   Stand-Sit Independence contact guard assist   Sit-Stand-Sit, Assist Device walker, front wheeled   Transfer Impairments balance impaired;sensation decreased;sensory feedback impaired   Transfer Comment Fast transfer sit to stand using arms to push up quickly, then using walker to steady self.   Gait Assessment/Treatment   Total Distance Ambulated 60   Independence  minimum assist (75% patient effort);contact guard assist   Assistive Device  walker, front wheeled   Distance in  Feet 60 feet   Gait Speed average to slow   Deviations  weight-shifting ability decreased;other (see comments)  (wide BOS, walks with legs stiff with minimal knee flexion. States he walks better barefoot or in socks than when he has shoes on, stating the shoes catch on the floor.)   Comment Patient has high risk for falls. On a couple occassions after or during turning, his right foot got outside of the walker and he needed cued to adjust. Feel that sensory loss and numbness in feet/legs affects his proprioception and causes his gait deficits.   Motor Skills/Interventions   Additional Documentation Licensed conveyancer (Group)   Balance   Sitting Balance: Static good balance   Sitting, Dynamic (Balance) good balance   Sit-to-Stand Balance fair - balance  (with walker)   Standing Balance: Static fair balance  (with walker)   Standing Balance: Dynamic fair - balance  (with walker)   Post Treatment Status   Post Treatment Patient Status Patient sitting in bedside chair or w/c;Call light within reach   Support Present Post Treatment  None   Communication Post Treatement Nurse   Communication Post Treatment Comment updated on status of eval   Patient Effort good   Post-Treatment Vital Signs   Post-treatment Heart Rate (beats/min) 75   Post SpO2 (%) 96   O2 Delivery Post Treatment room air   Post-Treatment Pain   Posttreatment Pain Rating 0/10 - no pain   Physical Therapy Clinical Impression   Assessment Patient was admitted with Covid, generalized weakness and ataxia. He participated well in PT evaluation with good effort. He completed sit to stand transfer in a quick movment using his arms on chair to propel him up off the chair and grabbed walker to steady himself as he fully found his COG and adjusted his  footing. He ambulated 3 times to the door and back due to isolation with FWW and  Min/CGA and min cues for positioning of walker and foot placement. He has significant sensory loss and numbness in both feet and  lower legs which affects his gait quality and increases his risk for falls. With ambulation his legs began to fatigue so he returned to sit in chair transferring stand to sit with CGA. He will benefit from continued PT services throughout remainder of hospital stay and following discharge at inpatient rehab facility prior to returning to home.   Criteria for Skilled Therapeutic meets criteria   Impairments Found (describe specific impairments) aerobic capacity/endurance;gait, locomotion, and balance;muscle performance;sensory integrity   Functional Limitations in Following  self-care;home management;community/leisure   Rehab Potential good   Therapy Frequency other (see comments)  (1-2x/day at least one day weekly)   Predicted Duration of Therapy Intervention (days/wks) until discharge   Anticipated Equipment Needs at Discharge (PT) front wheeled walker;wheelchair   Anticipated Discharge Disposition inpatient rehabilitation facility   Evaluation Complexity Justification   Patient History: Co-morbidity/factors that impact Plan of Care 3 or more that impact Plan of Care   Examination Components 3 or more Exam elements addressed   Presentation Evolving: Symptoms, complaints, characteristics of condition changing &/or cognitive deficits present   Clinical Decision Making Low complexity   Evaluation Complexity Low complexity   Care Plan Goals   PT Rehab Goals Transfer Training Goal;Gait Training Goal   Planned Therapy Interventions, PT Eval   Planned Therapy Interventions (PT) balance training;bed mobility training;gait training;home exercise program;patient/family education;strengthening;transfer training;other (see comments)  (sensory reintegration techniques)   Transfer Training Goal   Transfer Training Goal, Date Established 05/27/23   Transfer Training Goal, Time to Achieve by discharge   Transfer Training Goal, Activity Type all transfers   Transfer Training Goal, Current Status contact guard assist   Transfer  Training Goal, Independence Level supervision required   Transfer Training Goal, Assist Device walker, rolling;least restrictive assistive device   Transfer Training Goal, Additional Goal with inproved control and safety   Gait Training  Goal, Distance to Achieve   Gait Training  Goal, Date Established 05/27/23   Gait Training  Goal, Time to Achieve by discharge   Gait Training  Goal, Current Status minimum assist (75% patient effort);contact guard assist   Gait Training  Goal, Independence Level stand-by assistance   Gait Training  Goal, Assist Device walker, rolling   Gait Training  Goal, Distance to Achieve 150 feet   Gait Training  Goal, Additional Goal decreased BOS, increased knee flexion during swign through   Physical Therapy Time and Intention   Total PT Minutes: 27               INTERVENTION MINUTES: EVALUATION 17 minutes and GAIT TRAINING 10 MINUTES    EVALUATION COMPLEXITY : CLINICAL DECISION MAKING OF LOW COMPLEXITY AS INDICATED BY PMH, PHYSICAL THERAPY ASSESSMENT OF MUSCULOSKELETAL AND NEUROLOGICAL SYSTEMS AND ACTIVITY LIMITATIONS. CLINICAL PRESENTATION IS STABLE AND UNCOMPLICATED    Therapist:     Rollene Rotunda, PT  05/27/2023, 12:40

## 2023-05-28 DIAGNOSIS — F03911 Unspecified dementia, unspecified severity, with agitation (CMS HCC): Secondary | ICD-10-CM

## 2023-05-28 LAB — COMPREHENSIVE METABOLIC PANEL, NON-FASTING
ALBUMIN/GLOBULIN RATIO: 1.3 (ref 0.8–1.4)
ALBUMIN: 3.5 g/dL (ref 3.5–5.7)
ALKALINE PHOSPHATASE: 78 U/L (ref 34–104)
ALT (SGPT): 14 U/L (ref 7–52)
ANION GAP: 9 mmol/L (ref 4–13)
AST (SGOT): 29 U/L (ref 13–39)
BILIRUBIN TOTAL: 0.6 mg/dL (ref 0.3–1.0)
BUN/CREA RATIO: 26 — ABNORMAL HIGH (ref 6–22)
BUN: 26 mg/dL — ABNORMAL HIGH (ref 7–25)
CALCIUM, CORRECTED: 9.5 mg/dL (ref 8.9–10.8)
CALCIUM: 9.1 mg/dL (ref 8.6–10.3)
CHLORIDE: 107 mmol/L (ref 98–107)
CO2 TOTAL: 24 mmol/L (ref 21–31)
CREATININE: 1.01 mg/dL (ref 0.60–1.30)
ESTIMATED GFR: 73 mL/min/{1.73_m2} (ref >59)
GLOBULIN: 2.6 (ref 2.0–3.5)
GLUCOSE: 149 mg/dL — ABNORMAL HIGH (ref 74–109)
OSMOLALITY, CALCULATED: 287 mosm/kg (ref 270–290)
POTASSIUM: 3.4 mmol/L — ABNORMAL LOW (ref 3.5–5.1)
PROTEIN TOTAL: 6.1 g/dL — ABNORMAL LOW (ref 6.4–8.9)
SODIUM: 140 mmol/L (ref 136–145)

## 2023-05-28 LAB — CBC WITH DIFF
BASOPHIL #: 0 10*3/uL (ref 0.00–0.10)
BASOPHIL %: 0 % (ref 0–1)
EOSINOPHIL #: 0 10*3/uL (ref 0.00–0.50)
EOSINOPHIL %: 0 % — ABNORMAL LOW (ref 1–8)
HCT: 38.8 % (ref 36.7–47.1)
HGB: 13.2 g/dL (ref 12.5–16.3)
LYMPHOCYTE #: 0.5 10*3/uL — ABNORMAL LOW (ref 1.00–3.20)
LYMPHOCYTE %: 5 % — ABNORMAL LOW (ref 15–43)
MCH: 30.8 pg (ref 23.8–33.4)
MCHC: 33.9 g/dL (ref 32.5–36.3)
MCV: 90.8 fL (ref 73.0–96.2)
MONOCYTE #: 0.4 10*3/uL (ref 0.30–1.10)
MONOCYTE %: 4 % — ABNORMAL LOW (ref 6–14)
MPV: 8.3 fL (ref 7.4–11.4)
NEUTROPHIL #: 9.9 10*3/uL — ABNORMAL HIGH (ref 1.70–7.60)
NEUTROPHIL %: 92 % — ABNORMAL HIGH (ref 44–74)
PLATELETS: 186 10*3/uL (ref 140–440)
RBC: 4.27 10*6/uL (ref 4.06–5.63)
RDW: 14.7 % (ref 12.1–16.2)
WBC: 10.8 10*3/uL — ABNORMAL HIGH (ref 3.6–10.2)

## 2023-05-28 LAB — THROAT CULTURE, BETA HEMOLYTIC STREPTOCOCCUS: THROAT CULTURE: NORMAL

## 2023-05-28 LAB — MAGNESIUM: MAGNESIUM: 2.2 mg/dL (ref 1.9–2.7)

## 2023-05-28 NOTE — Progress Notes (Signed)
 [facility]  IP PROGRESS NOTE      Yuta, Cipollone  Date of Admission:  05/25/2023  Date of Birth:  09-19-1940  Date of Service:  05/28/2023    Hospital Day:  LOS: 0 days     Subjective:   Patient seen examined for follow-up of acute respiratory failure with hypoxia, COVID-19, generalized weakness.    2/28:  reports improvement since yesterday, on room air with oxygen sat in 90's.    3/1:  agitated this AM, did not sleep well last night    Vital Signs:  Temp (24hrs) Max:36.8 C (98.2 F)      Temperature: 36.8 C (98.2 F)  BP (Non-Invasive): 134/80  MAP (Non-Invasive): 97 mmHG  Heart Rate: 70  Respiratory Rate: 18  SpO2: 96 %    Current Medications:  acetaminophen (TYLENOL) tablet, 650 mg, Oral, Q4H PRN  aluminum-magnesium hydroxide-simethicone (MAG-AL PLUS) 200-200-20 mg per 5 mL oral liquid, 30 mL, Oral, Q4H PRN  arformoterol (BROVANA) 15 mcg/2 mL nebulizer solution, 15 mcg, Nebulization, 2x/day  atorvastatin (LIPITOR) tablet, 80 mg, Oral, NIGHTLY  bromocriptine (PARLODEL) tablet, 2.5 mg, Oral, Daily  budesonide (PULMICORT RESPULES) 0.5 mg/2 mL nebulizer suspension, 1 mg, Nebulization, 2x/day  dextromethorphan-guaiFENesin (ROBITUSSIN DM) 10-100mg  per 5mL oral liquid, 10 mL, Oral, Q4H PRN  diphenhydrAMINE (BENADRYL) capsule, 25 mg, Oral, HS PRN  donepezil (ARICEPT) tablet, 10 mg, Oral, NIGHTLY  heparin 5,000 unit/mL injection, 5,000 Units, Subcutaneous, Q8HRS  ipratropium-albuterol 0.5 mg-3 mg(2.5 mg base)/3 mL Solution for Nebulization, 3 mL, Nebulization, Q4H PRN  levothyroxine (SYNTHROID) tablet, 137 mcg, Oral, Daily  losartan (COZAAR) tablet, 25 mg, Oral, Daily  methylPREDNISolone sod succ (SOLU-medrol) 40 mg/mL injection, 20 mg, Intravenous, Q6H  metoprolol succinate (TOPROL-XL) 24 hr extended release tablet, 25 mg, Oral, Daily  NS flush syringe, 3 mL, Intracatheter, Q8HRS  NS flush syringe, 3 mL, Intracatheter, Q1H PRN  ondansetron (ZOFRAN) 2 mg/mL  injection, 4 mg, Intravenous, Q6H PRN  pantoprazole (PROTONIX) delayed release tablet, 40 mg, Oral, Daily  revefenacin (YUPELRI) 175 mcg/3 mL nebulizer solution, 175 mcg, Nebulization, Daily  tamsulosin (FLOMAX) capsule, 0.4 mg, Oral, Daily after Dinner  torsemide (DEMADEX) tablet, 10 mg, Oral, Daily        Current Orders:  Active Orders   Diet    DIET CARDIAC (2G NA, LOWFAT, LOW CHOL) Do you want to initiate MNT Protocol? Yes     Frequency: All Meals     Number of Occurrences: 1 Occurrences   Nursing    ACTIVITY     Frequency: UNTIL DISCONTINUED     Number of Occurrences: Until Specified    INTAKE AND OUTPUT QSHIFT     Frequency: QSHIFT     Number of Occurrences: Until Specified    MISCELLANEOUS MD/DO TO NURSE     Frequency: UNTIL DISCONTINUED     Number of Occurrences: Until Specified     Order Comments: Please confirm home medications and let hospitalist know when this is done so that they can be restarted.      Notify MD Vital Signs     Frequency: PRN     Number of Occurrences: Until Specified    NURSE TO ENTER SECONDARY ORDER Other - (specify in comments) (CHEST PAIN AND/OR ARRYTHMIA)     Frequency: UNTIL DISCONTINUED     Number of Occurrences: Until Specified    PT IS HIGH RISK FOR VENOUS THROMBOEMBOLISM     Frequency: CONTINUOUS     Number  of Occurrences: Until Specified    PULSE OXIMETRY CONTINUOUS     Frequency: CONTINUOUS     Number of Occurrences: Until Specified    TELEMETRY MONITORING - Continuous     Frequency: CONTINUOUS     Number of Occurrences: Until Specified    VITAL SIGNS  Q4H     Frequency: Q4H     Number of Occurrences: Until Specified   Code Status    FULL CODE: ATTEMPT RESUSCITATION / CPR     Frequency: CONTINUOUS     Number of Occurrences: Until Specified     Order Comments: Patient wishes for full ICU level care including advanced airway interventions / mechanical ventilation.     In the event of pulseless cardiac arrest, patient consents to ACLS (advanced cardiac life support) to attempt  resuscitation.  IE - Consents to chest compressions, life support including intubation, mechanical ventilation, defibrillation/cardioversion as indicated.         Consult    IP CONSULT TO CARE MANAGEMENT     Frequency: ONE TIME     Number of Occurrences: 1 Occurrences     Order Comments: Generalized weakness, difficulty ambulating, may need rehab or outpatient servicers      IP CONSULT TO PSYCHIATRY On-Call Provider (nurse/clerk to determine)     Frequency: ONE TIME     Number of Occurrences: 1 Occurrences   Isolation    ENHANCED DROPLET ISOLATION     Frequency: CONTINUOUS     Number of Occurrences: Until Specified     Order Comments: Private room  Wear a fitted N95/MSA/CAPR when entering room  Wear gown, gloves, and eye protection as indicated  Prior to transport, notify receiving department of precautions  Prior to transport, ensure patient is wearing a mask and follows Respiratory Hygiene/Cough Etiquette         OT    OT EVAL & TREAT     Frequency: Per Therapist Discretion     Number of Occurrences: 1 Occurrences     Scheduling Instructions:               PT    PT EVALUATE AND TREAT     Frequency: Per Therapist Discretion     Number of Occurrences: 1 Occurrences     Order Comments: If patient's O2 level drops, PT may increase O2 until sats are > 93%.       Scheduling Instructions:               Respiratory Care    ACCUPAP SYSTEM     Frequency: Q4H WHILE AWAKE     Number of Occurrences: Until Specified    OXYGEN - NASAL CANNULA     Frequency: CONTINUOUS     Number of Occurrences: Until Specified     Order Comments: Flowrate should not exeed 6L/min except [facility] facility.     Titrate oxygen to keep sat 90-92%     IV    INSERT & MAINTAIN PERIPHERAL IV ACCESS     Frequency: UNTIL DISCONTINUED     Number of Occurrences: Until Specified    PERIPHERAL IV DRESSING CHANGE     Frequency: PRN     Number of Occurrences: Until Specified   Medications    acetaminophen (TYLENOL) tablet     Frequency: Q4H PRN     Dose: 650 mg      Route: Oral    aluminum-magnesium hydroxide-simethicone (MAG-AL PLUS) 200-200-20 mg per 5 mL oral liquid     Frequency: Q4H  PRN     Dose: 30 mL     Route: Oral    arformoterol (BROVANA) 15 mcg/2 mL nebulizer solution     Frequency: 2x/day     Dose: 15 mcg     Route: Nebulization    atorvastatin (LIPITOR) tablet     Frequency: NIGHTLY     Dose: 80 mg     Route: Oral    bromocriptine (PARLODEL) tablet     Frequency: Daily     Dose: 2.5 mg     Route: Oral    budesonide (PULMICORT RESPULES) 0.5 mg/2 mL nebulizer suspension     Frequency: 2x/day     Dose: 1 mg     Route: Nebulization    dexAMETHasone 4 mg/mL injection     Frequency: Daily     Dose: 6 mg     Route: IntraMUSCULAR    dextromethorphan-guaiFENesin (ROBITUSSIN DM) 10-100mg  per 5mL oral liquid     Frequency: Q4H PRN     Dose: 10 mL     Route: Oral    diphenhydrAMINE (BENADRYL) capsule     Frequency: HS PRN     Dose: 25 mg     Route: Oral    donepezil (ARICEPT) tablet     Frequency: NIGHTLY     Dose: 10 mg     Route: Oral    heparin 5,000 unit/mL injection     Frequency: Q8HRS     Dose: 5,000 Units     Route: Subcutaneous    ipratropium-albuterol 0.5 mg-3 mg(2.5 mg base)/3 mL Solution for Nebulization     Frequency: Q4H PRN     Dose: 3 mL     Route: Nebulization    levothyroxine (SYNTHROID) tablet     Frequency: Daily     Dose: 137 mcg     Route: Oral    losartan (COZAAR) tablet     Frequency: Daily     Dose: 25 mg     Route: Oral    methylPREDNISolone sod succ (SOLU-medrol) 40 mg/mL injection     Frequency: Q6H     Dose: 20 mg     Route: Intravenous    metoprolol succinate (TOPROL-XL) 24 hr extended release tablet     Frequency: Daily     Dose: 25 mg     Route: Oral    NS flush syringe     Frequency: Q8HRS     Dose: 3 mL     Route: Intracatheter    NS flush syringe     Frequency: Q1H PRN     Dose: 3 mL     Route: Intracatheter    ondansetron (ZOFRAN) 2 mg/mL injection     Frequency: Q6H PRN     Dose: 4 mg     Route: Intravenous    pantoprazole (PROTONIX)  delayed release tablet     Frequency: Daily     Dose: 40 mg     Route: Oral    revefenacin (YUPELRI) 175 mcg/3 mL nebulizer solution     Frequency: Daily     Dose: 175 mcg     Route: Nebulization    tamsulosin (FLOMAX) capsule     Frequency: Daily after Dinner     Dose: 0.4 mg     Route: Oral    torsemide (DEMADEX) tablet     Frequency: Daily     Dose: 10 mg     Route: Oral        Review of Systems:  Focused review  of system was completed. Refer to the HPI for ROS details.     Today's Physical Exam:  Physical Exam  Cardiovascular:      Rate and Rhythm: Normal rate and regular rhythm.   Pulmonary:      Breath sounds: Rhonchi present.   Abdominal:      General: Abdomen is flat.      Palpations: Abdomen is soft.   Skin:     General: Skin is warm and dry.   Neurological:      Mental Status: He is alert and oriented to person, place, and time.   Psychiatric:         Mood and Affect: Mood normal.        I/O:  I/O last 24 hours:    Intake/Output Summary (Last 24 hours) at 05/28/2023 1113  Last data filed at 05/28/2023 0924  Gross per 24 hour   Intake 540 ml   Output 550 ml   Net -10 ml     I/O current shift:  03/01 0700 - 03/01 1859  In: 220 [P.O.:220]  Out: -     Labs  Please indicate ordered or reviewed)  Reviewed: I have reviewed all lab results.    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  Aricept 10 mg nightly    Placement on  DC    Monay Houlton, DO      DVT/PE Prophylaxis: Heparin    Disposition Planning: Home discharge      Advance Care Planning Discussed:  No

## 2023-05-28 NOTE — Consults (Signed)
 Psychiatric Consultation    Patient's Full Name: Tanner [name]   Patient's Date of Birth: 12/07/1940   Patient's Age: 83 y.o.   Patient's Legal Sex: male   Patient's MRN: [id number]   Patient's Date of Admission: 05/25/2023   Current Date: 05/28/2023 13:42     Patient's Room/Bed: 308/A       Chief Complaint:  Increased confusion overnight      History of Present Illness:  Patient is a 83 year old male admitted to the med surge floor for treatment of COVID.  Patient reports significant decline over the last couple of weeks prior to admission .  Patient with history of dementia and insomnia.  Patient will reports that he has started forgetting names of close relatives.  Consult placed for dementia with increased agitation , after events last night.  Patient is awake alert, he recalls events from last night where he became confused.  He reports that he did wake up as he was dreaming and thoughts that he was at home, reports that once he made it to the door he realized that he was in the hospital and not at home.  Patient reports he often has trouble sleeping at night taking Benadryl frequently.  Discussed that Benadryl was not the best option for sleep.  Staff reports no agitation or sundowning during the day.  Does endorse some mild depression, and sadness over not being able to do 2 things physically that he used to do.      Psychiatric History:    Past Diagnoses:  Dementia    Treatment History:   History of Inpatient Psychiatric Treatment?  Denies  History of Outpatient Psychiatric Treatment?  Aricept followed by PCP      Substance Abuse History:    Alcohol Use:   Any history of blackouts, DTs, withdrawal symptoms?  Denies    Drug Use:  Denies    Tobacco Use:  Denies      Medications and Allergies:    Allergies:  Allergies   Allergen Reactions    Sulfa (Sulfonamides) Rash and Nausea/ Vomiting      (I.e. ACE Inhibitors, NSAIDs, ASA, Fish/Seafood, Latex, Nuts, Penicillin, Sulfa)    Current Psychiatric Medications:      Social History:    Living Situation:  Owns his home with wife    Marital Status:  Married    Highest Level of Education:  High school    State of Employment:  Retired Music therapist       Vital Signs:    Filed Vitals:    05/28/23 0427 05/28/23 0730 05/28/23 1114 05/28/23 1214   BP: (!) 152/65 134/80  (!) 147/67   Pulse: 64 70 62 69   Resp: 18 18  18    Temp: 36.5 C (97.7 F) 36.8 C (98.2 F)  36.4 C (97.5 F)   SpO2: 96%   92%        Labs:    Results for orders placed or performed during the hospital encounter of 05/25/23 (from the past 24 hours)   CBC/DIFF    Narrative    The following orders were created for panel order CBC/DIFF.  Procedure                               Abnormality         Status                     ---------                               -----------         ------  CBC WITH OVFI[[id number]]                Abnormal            Final result                 Please view results for these tests on the individual orders.   COMPREHENSIVE METABOLIC PANEL, NON-FASTING   Result Value Ref Range    SODIUM 140 136 - 145 mmol/L    POTASSIUM 3.4 (L) 3.5 - 5.1 mmol/L    CHLORIDE 107 98 - 107 mmol/L    CO2 TOTAL 24 21 - 31 mmol/L    ANION GAP 9 4 - 13 mmol/L    BUN 26 (H) 7 - 25 mg/dL    CREATININE 4.16 6.06 - 1.30 mg/dL    BUN/CREA RATIO 26 (H) 6 - 22    ESTIMATED GFR 73 >59 mL/min/1.82m^2    ALBUMIN 3.5 3.5 - 5.7 g/dL    CALCIUM 9.1 8.6 - 30.1 mg/dL    GLUCOSE 601 (H) 74 - 109 mg/dL    ALKALINE PHOSPHATASE 78 34 - 104 U/L    ALT (SGPT) 14 7 - 52 U/L    AST (SGOT) 29 13 - 39 U/L    BILIRUBIN TOTAL 0.6 0.3 - 1.0 mg/dL    PROTEIN TOTAL 6.1 (L) 6.4 - 8.9 g/dL    ALBUMIN/GLOBULIN RATIO 1.3 0.8 - 1.4    OSMOLALITY, CALCULATED 287 270 - 290 mOsm/kg    CALCIUM, CORRECTED 9.5 8.9 - 10.8 mg/dL    GLOBULIN 2.6 2.0 - 3.5    Narrative    Estimated Glomerular Filtration Rate (eGFR) is calculated using the CKD-EPI (2021) equation, intended for patients 53 years of age and older. If gender is not documented  or "unknown", there will be no eGFR calculation.     MAGNESIUM   Result Value Ref Range    MAGNESIUM 2.2 1.9 - 2.7 mg/dL   CBC WITH DIFF   Result Value Ref Range    WBC 10.8 (H) 3.6 - 10.2 x10^3/uL    RBC 4.27 4.06 - 5.63 x10^6/uL    HGB 13.2 12.5 - 16.3 g/dL    HCT 09.3 23.5 - 57.3 %    MCV 90.8 73.0 - 96.2 fL    MCH 30.8 23.8 - 33.4 pg    MCHC 33.9 32.5 - 36.3 g/dL    RDW 22.0 25.4 - 27.0 %    PLATELETS 186 140 - 440 x10^3/uL    MPV 8.3 7.4 - 11.4 fL    NEUTROPHIL % 92 (H) 44 - 74 %    LYMPHOCYTE % 5 (L) 15 - 43 %    MONOCYTE % 4 (L) 6 - 14 %    EOSINOPHIL % 0 (L) 1 - 8 %    BASOPHIL % 0 0 - 1 %    NEUTROPHIL # 9.90 (H) 1.70 - 7.60 x10^3/uL    LYMPHOCYTE # 0.50 (L) 1.00 - 3.20 x10^3/uL    MONOCYTE # 0.40 0.30 - 1.10 x10^3/uL    EOSINOPHIL # 0.00 0.00 - 0.50 x10^3/uL    BASOPHIL # 0.00 0.00 - 0.10 x10^3/uL        Physical Exam:     Please see dictated hospitalist note    Current Facility-Administered Medications   Medication Dose Route Frequency    acetaminophen (TYLENOL) tablet  650 mg Oral Q4H PRN    aluminum-magnesium hydroxide-simethicone (MAG-AL PLUS) 200-200-20 mg per 5 mL oral liquid  30 mL  Oral Q4H PRN    arformoterol (BROVANA) 15 mcg/2 mL nebulizer solution  15 mcg Nebulization 2x/day    atorvastatin (LIPITOR) tablet  80 mg Oral NIGHTLY    bromocriptine (PARLODEL) tablet  2.5 mg Oral Daily    budesonide (PULMICORT RESPULES) 0.5 mg/2 mL nebulizer suspension  1 mg Nebulization 2x/day    dextromethorphan-guaiFENesin (ROBITUSSIN DM) 10-100mg  per 5mL oral liquid  10 mL Oral Q4H PRN    diphenhydrAMINE (BENADRYL) capsule  25 mg Oral HS PRN    donepezil (ARICEPT) tablet  10 mg Oral NIGHTLY    heparin 5,000 unit/mL injection  5,000 Units Subcutaneous Q8HRS    ipratropium-albuterol 0.5 mg-3 mg(2.5 mg base)/3 mL Solution for Nebulization  3 mL Nebulization Q4H PRN    levothyroxine (SYNTHROID) tablet  137 mcg Oral Daily    losartan (COZAAR) tablet  25 mg Oral Daily    metoprolol succinate (TOPROL-XL) 24 hr extended  release tablet  25 mg Oral Daily    NS flush syringe  3 mL Intracatheter Q8HRS    NS flush syringe  3 mL Intracatheter Q1H PRN    ondansetron (ZOFRAN) 2 mg/mL injection  4 mg Intravenous Q6H PRN    pantoprazole (PROTONIX) delayed release tablet  40 mg Oral Daily    revefenacin (YUPELRI) 175 mcg/3 mL nebulizer solution  175 mcg Nebulization Daily    tamsulosin (FLOMAX) capsule  0.4 mg Oral Daily after Dinner    torsemide (DEMADEX) tablet  10 mg Oral Daily        Mental Status Examination:    Sensorium/Alertness: Alert, Awake oriented  Orientation: None, Date, Person, Place  Appearance:Appears stated age  Psychomotor Activity: Normal, Cooperative  Abnormal Behaviors: None  Attitude Towards Examiner: Attentive, Cooperative  Eye Contact: Normal,   Speech: Normal/Spontaneous  Mood: Ok  Affect: Appropriate  Perception: WNL  Though Process: Logical/Clear    Thought Content: Suicidal?  Denies  Thought Content: Homicidal?  Denies  Thought Content: Delusions?  Denies  Impulse Control: Undetermined    How was the patient's Concentration/Calculation/Attention tested/assessed? Per observation and interview with patient   Recent Memory: WNL  Remote Memory: WNL  How was the patient's Remote Memory Tested/Assessed? Past Events, as it relates to history  Intelligence/Fund of Knowledge: Average  How was the patient's Intelligence/Fund of Knowledge Tested/Assessed? Based on history, Based on vocabulary, syntax, grammar, and content  Judgement: Fair,  How was the patient's Judgement Tested/Assessed? Per patient's behavior/history of present illness  Insight: Fair,  How was the patient's Insight Tested/Assessed? Understanding of severity of illness/history of present illness       Discussed with family/guardian/significant other:      Clinical Conclusion:    Overall Impression:  36-old male with history of dementia currently on Aricept.  Inpatient for treatment of COVID.  Recent bout of confusion and agitation last p.m.    Diagnoses:    Dementia      Treatment and Medication Recommendation:  Recommend increasing Donepezil to 23mg  daily, addition of Remeron 15 mg qhs     Interaction Attestation: Clinical telemedicine services delivered using HIPAA-compliant interactive video-audio telecommunications while the patient and the rendering provider were not in the same physical location. Patient agreeable to telecommunication    TELEMEDICINE DOCUMENTATION:    Patient Location:  [facility], [address], [place], [place] [address]   Provider Location: Remote  Patient/family aware of provider location:  yes  Patient/family consent for telemedicine:  yes  Examination observed and performed by: Genevive Bi, PMHNP-BC

## 2023-05-29 DIAGNOSIS — I110 Hypertensive heart disease with heart failure: Secondary | ICD-10-CM

## 2023-05-29 MED ORDER — BROMOCRIPTINE 2.5 MG TABLET
2.5000 mg | ORAL_TABLET | Freq: Every day | ORAL | Status: DC
Start: 2023-05-29 — End: 2023-06-01
  Administered 2023-05-29: 0 mg via ORAL
  Administered 2023-05-30 – 2023-05-31 (×2): 2.5 mg via ORAL
  Administered 2023-06-01: 0 mg via ORAL
  Filled 2023-05-29 (×5): qty 1

## 2023-05-29 MED ORDER — DONEPEZIL 23 MG TABLET
23.0000 mg | ORAL_TABLET | Freq: Every evening | ORAL | 0 refills | Status: AC
Start: 2023-05-29 — End: 2023-07-26

## 2023-05-29 MED ORDER — DONEPEZIL 5 MG TABLET
23.0000 mg | ORAL_TABLET | Freq: Every evening | ORAL | Status: DC
Start: 2023-05-29 — End: 2023-06-01
  Administered 2023-05-29 – 2023-05-31 (×3): 22.5 mg via ORAL
  Filled 2023-05-29 (×3): qty 1

## 2023-05-29 NOTE — Care Plan (Signed)
 Stable at present. Waiting for placement for PT.

## 2023-05-29 NOTE — Discharge Summary (Signed)
 [facility]  DISCHARGE SUMMARY    PATIENT NAME:  Tanner [name], Tanner [name]  MRN:  [id number]  DOB:  Mar 16, 1941    ENCOUNTER DATE:  05/25/2023  INPATIENT ADMISSION DATE: 05/28/2023  DISCHARGE DATE:  06/01/2023    ATTENDING PHYSICIAN: Antony Contras, DO  SERVICE: PRN HOSPITALIST 4  PRIMARY CARE PHYSICIAN: Craig Staggers, MD       No lay caregiver identified.    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     Active Non-Hospital Problems    Diagnosis Date Noted    Low back pain 06/10/2022    Hypertension 10/27/2021    Coronary artery disease 10/27/2021             Current Discharge Medication List        CONTINUE these medications - NO CHANGES were made during your visit.        Details   albuterol sulfate 90 mcg/actuation oral inhaler  Commonly known as: PROVENTIL or VENTOLIN or PROAIR   2 Puffs, DAILY  Refills: 0     aspirin 81 mg Tablet, Chewable   81 mg, DAILY  Refills: 0     atorvastatin 80 mg Tablet  Commonly known as: LIPITOR   1 Tablet, NIGHTLY  Refills: 0     bromocriptine 2.5 mg Tablet  Commonly known as: PARLODEL   2.5 mg, DAILY  Refills: 0     calcium carbonate-vitamin D3 600 mg-10 mcg (400 unit) Tablet   2 Tablets, DAILY  Refills: 0     diphenhydrAMINE 50 mg Capsule  Commonly known as: BENADRYL   1 Capsule, NIGHTLY  Refills: 0     donepeziL 10 mg Tablet  Commonly known as: ARICEPT   10 mg, NIGHTLY  Refills: 0     ketotifen fumarate 0.025 % (0.035 %) Drops  Commonly known as: EYE ITCH RELIEF   1 Drop, 2 TIMES DAILY  Refills: 0     levothyroxine 137 mcg Tablet  Commonly known as: SYNTHROID   1 Tablet, DAILY  Refills: 0     losartan 50 mg Tablet  Commonly known as:  COZAAR   25 mg, DAILY  Refills: 0     metoprolol succinate 25 mg Tablet Sustained Release 24 hr  Commonly known as: TOPROL-XL   25 mg, Oral, DAILY  Qty: 90 Tablet  Refills: 4     omeprazole 20 mg Capsule, Delayed Release(E.C.)  Commonly known as: PRILOSEC   20 mg, DAILY  Refills: 0     polyethylene glycol 17 gram/dose Powder  Commonly known as: MIRALAX   TAKE 1 CAPFUL (17GM) BY MOUTH EVERY DAY (MIX POWDER IN 4 TO 8 OUNCES OF FLUID)  Refills: 0     tamsulosin 0.4 mg Capsule  Commonly known as: FLOMAX   0.4 mg, Oral, EVERY EVENING AFTER DINNER  Qty: 30 Capsule  Refills: 5     torsemide 20 mg Tablet  Commonly known as: DEMADEX   0.5 Tablets, DAILY  Refills: 0            STOP taking these medications.      ergocalciferol (vitamin D2) 1,250 mcg (50,000 unit)  Capsule  Commonly known as: DRISDOL     fluticasone propion-salmeteroL 500-50 mcg/dose oral diskus inhaler  Commonly known as: ADVAIR     ibuprofen 400 mg Tablet  Commonly known as: MOTRIN            Discharge med list refreshed?  YES     Allergies   Allergen Reactions    Sulfa (Sulfonamides) Rash and Nausea/ Vomiting     HOSPITAL PROCEDURE(S):   No orders of the defined types were placed in this encounter.      REASON FOR HOSPITALIZATION AND HOSPITAL COURSE   BRIEF HPI:  This is a 83 y.o., male admitted for acute respiratory failure with hypoxia, COVID-19, generalized weakness.   BRIEF HOSPITAL NARRATIVE: See H&P for admission details    Acute respiratory failure with hypoxia likely secondary to COVID-19:  resolved, currently on room air with oxygen saturation in the 90's.  On admission patient initially required 2 L oxygen via nasal cannula.  Chest x-ray unremarkable for infiltrate.     COVID-19:  Has completed therapy with steroids, patient was not a candidate for remdesivir therapy.     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  Psych consult done - see note for  details.    CONDITION ON DISCHARGE:  A. Ambulation: Full ambulation  B. Self-care Ability: Complete  C. Cognitive Status Alert  D. Code status at discharge:       LINES/DRAINS/WOUNDS AT DISCHARGE:   Patient Lines/Drains/Airways Status       Active Line / Dialysis Catheter / Dialysis Graft / Drain / Airway / Wound       Name Placement date Placement time Site Days    Peripheral IV Distal;Right Basilic  (medial side of arm) 05/25/23  1937  -- 3                    DISCHARGE DISPOSITION:  Rehab Hospital  DISCHARGE INSTRUCTIONS:  Post-Discharge Follow Up Appointments       Follow up with Craig Staggers, MD    Phone: [phone]    Where: [address] [facility] [address]      Monday Jun 06, 2023    Return Patient Visit with Dyke Brackett, DO at  8:20 AM      Thursday Jun 23, 2023    Ultrasound with PRN [place] ROOM 1 at  1:00 PM      Thursday Jun 30, 2023    Return Patient Visit with Arlana Hove, DO at 11:00 AM      Imaging Services, [facility] [place]   [address]  [place] [address]  [phone] Neurology, [facility]  [address], [place]  [address]  [place] [address]  [phone] Urology, [facility] [place], [place]  [address]  [place] [address]  [phone]          No discharge procedures on file.       Antony Contras, DO    Copies sent to Care Team         Relationship Specialty Notifications Start End    Craig Staggers, MD PCP - General INTERNAL MEDICINE  10/12/21     Phone: [phone] Fax: [phone]         [address] [place] [address]  Onnie Boer, MD  CARDIOVASCULAR DISEASE  11/02/21     Phone: [phone] Fax: [phone]         [address] [facility] [address] [place] [address]    Esmond Plants, MD  GENERAL SURGERY  11/02/21     Phone: [phone] Fax: [phone]         [address] [place] [address]    Arlana Hove,  DO  UROLOGY  11/02/21     Phone: [phone] Fax: [phone]         [address] [place] [address]            Referring providers can utilize [email or web address] to access their referred [facility] patient's information.

## 2023-05-29 NOTE — Nurses Notes (Signed)
 I spoke with Misty at [facility] and they do not have any isolation beds.They have a list of patients waiting for an isolation bed.Dr. Ashley Jacobs notified.

## 2023-05-30 LAB — ADULT ROUTINE BLOOD CULTURE, SET OF 2 BOTTLES (BACTERIA AND YEAST)
BLOOD CULTURE, ROUTINE: NO GROWTH
BLOOD CULTURE, ROUTINE: NO GROWTH

## 2023-05-30 NOTE — PT Treatment (Signed)
 [facility] Medicine [facility]  [address]  [place], [address]  [phone]  (Fax) [phone]  Rehabilitation Department  Physical Therapy Daily Inpatient Note    Date: 05/30/2023  Patient's Name: Tanner [name]  Date of Birth: Aug 12, 1940  Height: Height: 182.9 cm (6' 0.01")  Weight: Weight: 91.4 kg (201 lb 9.6 oz)      Plan: Will continue under current POC.      Discharge Disposition: home with assist        Subjective/Objective/Assessment:  Flowsheet    05/30/23 0854   Rehab Session   Document Type therapy progress note (daily note)   PT Visit Date 05/30/23   Total PT Minutes: 7   Patient Effort good   General Information   Patient Profile Reviewed yes   Medical Lines PIV Line;Telemetry   Respiratory Status room air   Existing Precautions/Restrictions droplet isolation   Pre Treatment Status   Pre Treatment Patient Status Patient sitting in bedside chair or w/c;Call light within reach   Support Present Pre Treatment  None   Cognition   Behavior/Mood Observations behavior appropriate to situation, WNL/WFL   Vital Signs   Vitals Comment not connected   Pain Assessment   Pretreatment Pain Rating 0/10 - no pain   Posttreatment Pain Rating 0/10 - no pain   Transfer Assessment/Treatment   Sit-Stand Independence contact guard assist   Stand-Sit Independence contact guard assist   Sit-Stand-Sit, Assist Device walker, front wheeled   Gait Assessment/Treatment   Total Distance Ambulated 120   Independence  contact guard assist   Assistive Device  walker, front wheeled   Balance   Sitting Balance: Static good balance   Sitting, Dynamic (Balance) good balance   Sit-to-Stand Balance fair - balance   Standing Balance: Static fair - balance   Standing Balance: Dynamic fair - balance   Post Treatment Status   Post Treatment Patient Status Patient sitting in bedside chair or w/c;Call light within reach   Support Present Post Treatment  None   Physical Therapy Clinical Impression   Assessment Patient  requires CGA to stand from vascular chair. He gaits with RW and CGA. He needs cues to increase knee flexion with gait. He returns to chair with needs in reach and chair check activated.   Anticipated Discharge Disposition home with assist               Goals:                stand-by assistance  walker, rolling  150 feet  decreased BOS, increased knee flexion during swign through         all transfers  supervision required  walker, rolling, least restrictive assistive device  with inproved control and safety                  Intervention minutes: GAIT TRAINING 7 MINUTES (no charge)     THERAPIST  Shakela Donati, PTA  05/30/2023, 12:20

## 2023-05-30 NOTE — Nurses Notes (Signed)
 Pt is in a very good mood. Calm, cooperative, and willing to move around to get better. Pt took meds and got in the chair this morning. Pt has been walking with a walker. Pt is discharged but is waiting for placement.

## 2023-05-30 NOTE — PT Treatment (Signed)
 [facility] Medicine [facility]  [address]  [place], [address]  [phone]  (Fax) [phone]  Rehabilitation Department  Physical Therapy Daily Inpatient Note    Date: 05/30/2023  Patient's Name: Tanner [name]  Date of Birth: 10-23-1940  Height: Height: 182.9 cm (6' 0.01")  Weight: Weight: 91.4 kg (201 lb 9.6 oz)      Plan: Will continue under current POC.      Discharge Disposition: home        Subjective/Objective/Assessment:  Flowsheet    05/30/23 1425   Rehab Session   Document Type therapy progress note (daily note)   PT Visit Date 05/30/23   Total PT Minutes: 10   Patient Effort good   General Information   Patient Profile Reviewed yes   Medical Lines PIV Line;Telemetry   Respiratory Status nasal cannula   Existing Precautions/Restrictions droplet isolation   Pre Treatment Status   Pre Treatment Patient Status Patient sitting in bedside chair or w/c;Call light within reach;Patient safety alarm activated;Nurse approved session   Support Present Pre Treatment  None   Cognition   Behavior/Mood Observations behavior appropriate to situation, WNL/WFL   Vital Signs   O2 Delivery Post Treatment room air   Vitals Comment not connected   Pain Assessment   Pretreatment Pain Rating 5/10   Posttreatment Pain Rating 5/10   Pre/Posttreatment Pain Comment back   Transfer Assessment/Treatment   Sit-Stand Independence contact guard assist   Stand-Sit Independence contact guard assist   Sit-Stand-Sit, Assist Device walker, front wheeled   Gait Assessment/Treatment   Total Distance Ambulated 250   Independence  contact guard assist   Assistive Device  walker, front wheeled   Balance   Sitting Balance: Static good balance   Sitting, Dynamic (Balance) good balance   Sit-to-Stand Balance fair - balance   Standing Balance: Static fair - balance   Standing Balance: Dynamic fair - balance   Post Treatment Status   Post Treatment Patient Status Patient sitting in bedside chair or w/c;Call light within reach    Support Present Post Treatment  None   Physical Therapy Clinical Impression   Assessment Paitent requires CGA from vascular chair. He gaits with RW and CGA, with improved knee flexion noted for the first 100 feet or so.  He returns to chair with needs in reach.   Anticipated Discharge Disposition home               Goals:                stand-by assistance  walker, rolling  150 feet  decreased BOS, increased knee flexion during swign through         all transfers  supervision required  walker, rolling, least restrictive assistive device  with inproved control and safety                  Intervention minutes: GAIT TRAINING 10 MINUTES    THERAPIST  Wayde Gopaul, PTA  05/30/2023, 15:52

## 2023-05-30 NOTE — OT Treatment (Signed)
 [facility] Medicine [facility]  [address]  [place], [address]  [phone]  (Fax) [phone]  Rehabilitation Department     Occupational Therapy Daily Inpatient Note    Date: 05/30/2023  Patient's Name: Tanner [name]  Date of Birth: 10-03-1940  Height: Height: 182.9 cm (6' 0.01")  Weight: Weight: 91.4 kg (201 lb 9.6 oz)        Plan: Will continue under current POC.         Subjective/Objective/Assessment:      05/30/23 1133   Rehab Session   Document Type therapy progress note (daily note)   OT Visit Date 05/30/23   Total OT Minutes: 18   Patient Effort good   General Information   Patient Profile Reviewed yes   Medical Lines PIV Line;Telemetry   Respiratory Status room air   Existing Precautions/Restrictions droplet isolation;fall precautions   Pre Treatment Status   Pre Treatment Patient Status Call light within reach;Patient sitting in bedside chair or w/c   Support Present Pre Treatment  None   Communication Pre Treatment  Charge Nurse   Communication Pre Treatment Comment clear for OT   Vital Signs   Vitals Comment not connceted   Pain Assessment   Pretreatment Pain Rating 0/10 - no pain   Posttreatment Pain Rating 0/10 - no pain   Cognition   Behavior/Mood Observations behavior appropriate to situation, WNL/WFL   Transfer Assessment/Treatment   Sit-Stand Independence contact guard assist   Stand-Sit Independence contact guard assist   Sit-Stand-Sit, Assist Device walker, front wheeled   Toilet Transfer Independence contact guard assist   Toilet Transfer Assist Device walker, front wheeled  (grab bar)   Post Treatment Status   Post Treatment Patient Status Patient sitting in bedside chair or w/c;Call light within reach;Patient safety alarm activated   Support Present Post Treatment  None   Clinical Impression   Functional Level at Time of Session patietn completd UB ROM endurance activity complete 2x15 of shoulder F/E ADD/ADB, chest press diagonals, rows, and bicep curls, and tirecp  extension. opatient completed sit to stnad with CGA and ambulated to bathroom to complete toileing with CGA. patient ambulated back to sink and wash hands and ambualted back to chair  with CGA.patient left in chair with needs within reach and safety alarm on.                 Intervention minutes: THERAPEUTIC ACTIVITY 18 minutes      THERAPIST  Christ Kick, COTA  05/30/2023, 13:37

## 2023-05-30 NOTE — Progress Notes (Signed)
  MEDICINE [facility]  [facility]  IP PROGRESS NOTE      Tanner [name], Tanner [name]  Date of Admission:  05/25/2023  Date of Birth:  24-Oct-1940  Date of Service:  05/30/2023    Hospital Day:  LOS: 2 days     Subjective:   Patient seen examined for follow-up of acute respiratory failure with hypoxia, COVID-19, generalized weakness.    2/28:  reports improvement since yesterday, on room air with oxygen sat in 90's.    3/1:  agitated this AM, did not sleep well last night    3/3:  no new complaints    Vital Signs:  Temp (24hrs) Max:36.6 C (97.9 F)      Temperature: 36.6 C (97.9 F)  BP (Non-Invasive): 136/72  MAP (Non-Invasive): 92 mmHG  Heart Rate: 60  Respiratory Rate: 18  SpO2: 98 %    Current Medications:  acetaminophen (TYLENOL) tablet, 650 mg, Oral, Q4H PRN  aluminum-magnesium hydroxide-simethicone (MAG-AL PLUS) 200-200-20 mg per 5 mL oral liquid, 30 mL, Oral, Q4H PRN  arformoterol (BROVANA) 15 mcg/2 mL nebulizer solution, 15 mcg, Nebulization, 2x/day  atorvastatin (LIPITOR) tablet, 80 mg, Oral, NIGHTLY  bromocriptine (PARLODEL) tablet, 2.5 mg, Oral, Daily  budesonide (PULMICORT RESPULES) 0.5 mg/2 mL nebulizer suspension, 1 mg, Nebulization, 2x/day  dextromethorphan-guaiFENesin (ROBITUSSIN DM) 10-100mg  per 5mL oral liquid, 10 mL, Oral, Q4H PRN  diphenhydrAMINE (BENADRYL) capsule, 25 mg, Oral, HS PRN  donepeziL (ARICEPT) tablet 22.5 mg, 22.5 mg, Oral, NIGHTLY  heparin 5,000 unit/mL injection, 5,000 Units, Subcutaneous, Q8HRS  ipratropium-albuterol 0.5 mg-3 mg(2.5 mg base)/3 mL Solution for Nebulization, 3 mL, Nebulization, Q4H PRN  levothyroxine (SYNTHROID) tablet, 137 mcg, Oral, Daily  losartan (COZAAR) tablet, 25 mg, Oral, Daily  metoprolol succinate (TOPROL-XL) 24 hr extended release tablet, 25 mg, Oral, Daily  NS flush syringe, 3 mL, Intracatheter, Q8HRS  NS flush syringe, 3 mL, Intracatheter, Q1H PRN  ondansetron (ZOFRAN) 2 mg/mL injection, 4 mg, Intravenous, Q6H PRN  pantoprazole  (PROTONIX) delayed release tablet, 40 mg, Oral, Daily  revefenacin (YUPELRI) 175 mcg/3 mL nebulizer solution, 175 mcg, Nebulization, Daily  tamsulosin (FLOMAX) capsule, 0.4 mg, Oral, Daily after Dinner  torsemide (DEMADEX) tablet, 10 mg, Oral, Daily        Current Orders:  Active Orders   Diet    DIET CARDIAC (2G NA, LOWFAT, LOW CHOL) Do you want to initiate MNT Protocol? Yes     Frequency: All Meals     Number of Occurrences: 1 Occurrences   Nursing    ACTIVITY     Frequency: UNTIL DISCONTINUED     Number of Occurrences: Until Specified    INTAKE AND OUTPUT QSHIFT     Frequency: QSHIFT     Number of Occurrences: Until Specified    MISCELLANEOUS MD/DO TO NURSE     Frequency: UNTIL DISCONTINUED     Number of Occurrences: Until Specified     Order Comments: Please confirm home medications and let hospitalist know when this is done so that they can be restarted.      Notify MD Vital Signs     Frequency: PRN     Number of Occurrences: Until Specified    NURSE TO ENTER SECONDARY ORDER Other - (specify in comments) (CHEST PAIN AND/OR ARRYTHMIA)     Frequency: UNTIL DISCONTINUED     Number of Occurrences: Until Specified    PT IS HIGH RISK FOR VENOUS THROMBOEMBOLISM     Frequency: CONTINUOUS     Number of Occurrences:  Until Specified    PULSE OXIMETRY CONTINUOUS     Frequency: CONTINUOUS     Number of Occurrences: Until Specified    TELEMETRY MONITORING - Continuous     Frequency: CONTINUOUS     Number of Occurrences: Until Specified    VITAL SIGNS  Q4H     Frequency: Q4H     Number of Occurrences: Until Specified   Code Status    FULL CODE: ATTEMPT RESUSCITATION / CPR     Frequency: CONTINUOUS     Number of Occurrences: Until Specified     Order Comments: Patient wishes for full ICU level care including advanced airway interventions / mechanical ventilation.     In the event of pulseless cardiac arrest, patient consents to ACLS (advanced cardiac life support) to attempt resuscitation.  IE - Consents to chest compressions,  life support including intubation, mechanical ventilation, defibrillation/cardioversion as indicated.         Consult    IP CONSULT TO CARE MANAGEMENT     Frequency: ONE TIME     Number of Occurrences: 1 Occurrences     Order Comments: Generalized weakness, difficulty ambulating, may need rehab or outpatient servicers      IP CONSULT TO PSYCHIATRY On-Call Provider (nurse/clerk to determine)     Frequency: ONE TIME     Number of Occurrences: 1 Occurrences   Isolation    ENHANCED DROPLET ISOLATION     Frequency: CONTINUOUS     Number of Occurrences: Until Specified     Order Comments: Private room  Wear a fitted N95/MSA/CAPR when entering room  Wear gown, gloves, and eye protection as indicated  Prior to transport, notify receiving department of precautions  Prior to transport, ensure patient is wearing a mask and follows Respiratory Hygiene/Cough Etiquette         OT    OT EVAL & TREAT     Frequency: Per Therapist Discretion     Number of Occurrences: 1 Occurrences     Scheduling Instructions:               PT    PT EVALUATE AND TREAT     Frequency: Per Therapist Discretion     Number of Occurrences: 1 Occurrences     Order Comments: If patient's O2 level drops, PT may increase O2 until sats are > 93%.       Scheduling Instructions:               Respiratory Care    ACCUPAP SYSTEM     Frequency: Q4H WHILE AWAKE     Number of Occurrences: Until Specified    OXYGEN - NASAL CANNULA     Frequency: CONTINUOUS     Number of Occurrences: Until Specified     Order Comments: Flowrate should not exeed 6L/min except [facility] facility.     Titrate oxygen to keep sat 90-92%     IV    INSERT & MAINTAIN PERIPHERAL IV ACCESS     Frequency: UNTIL DISCONTINUED     Number of Occurrences: Until Specified    PERIPHERAL IV DRESSING CHANGE     Frequency: PRN     Number of Occurrences: Until Specified   Discharge    DISCHARGE PATIENT     Frequency: ONE TIME     Number of Occurrences: 1 Occurrences     Order Comments: DC to [facility] when bed  available     Medications    acetaminophen (TYLENOL) tablet  Frequency: Q4H PRN     Dose: 650 mg     Route: Oral    aluminum-magnesium hydroxide-simethicone (MAG-AL PLUS) 200-200-20 mg per 5 mL oral liquid     Frequency: Q4H PRN     Dose: 30 mL     Route: Oral    arformoterol (BROVANA) 15 mcg/2 mL nebulizer solution     Frequency: 2x/day     Dose: 15 mcg     Route: Nebulization    atorvastatin (LIPITOR) tablet     Frequency: NIGHTLY     Dose: 80 mg     Route: Oral    bromocriptine (PARLODEL) tablet     Frequency: Daily     Dose: 2.5 mg     Route: Oral    budesonide (PULMICORT RESPULES) 0.5 mg/2 mL nebulizer suspension     Frequency: 2x/day     Dose: 1 mg     Route: Nebulization    dextromethorphan-guaiFENesin (ROBITUSSIN DM) 10-100mg  per 5mL oral liquid     Frequency: Q4H PRN     Dose: 10 mL     Route: Oral    diphenhydrAMINE (BENADRYL) capsule     Frequency: HS PRN     Dose: 25 mg     Route: Oral    donepeziL (ARICEPT) tablet 22.5 mg     Frequency: NIGHTLY     Dose: 22.5 mg     Route: Oral    heparin 5,000 unit/mL injection     Frequency: Q8HRS     Dose: 5,000 Units     Route: Subcutaneous    ipratropium-albuterol 0.5 mg-3 mg(2.5 mg base)/3 mL Solution for Nebulization     Frequency: Q4H PRN     Dose: 3 mL     Route: Nebulization    levothyroxine (SYNTHROID) tablet     Frequency: Daily     Dose: 137 mcg     Route: Oral    losartan (COZAAR) tablet     Frequency: Daily     Dose: 25 mg     Route: Oral    metoprolol succinate (TOPROL-XL) 24 hr extended release tablet     Frequency: Daily     Dose: 25 mg     Route: Oral    NS flush syringe     Frequency: Q8HRS     Dose: 3 mL     Route: Intracatheter    NS flush syringe     Frequency: Q1H PRN     Dose: 3 mL     Route: Intracatheter    ondansetron (ZOFRAN) 2 mg/mL injection     Frequency: Q6H PRN     Dose: 4 mg     Route: Intravenous    pantoprazole (PROTONIX) delayed release tablet     Frequency: Daily     Dose: 40 mg     Route: Oral    revefenacin (YUPELRI) 175 mcg/3  mL nebulizer solution     Frequency: Daily     Dose: 175 mcg     Route: Nebulization    tamsulosin (FLOMAX) capsule     Frequency: Daily after Dinner     Dose: 0.4 mg     Route: Oral    torsemide (DEMADEX) tablet     Frequency: Daily     Dose: 10 mg     Route: Oral        Review of Systems:  Focused review of system was completed. Refer to the HPI for ROS details.     Today's Physical  Exam:  Physical Exam  Cardiovascular:      Rate and Rhythm: Normal rate and regular rhythm.   Pulmonary:      Breath sounds: Rhonchi present.   Abdominal:      General: Abdomen is flat.      Palpations: Abdomen is soft.   Skin:     General: Skin is warm and dry.   Neurological:      Mental Status: He is alert and oriented to person, place, and time.   Psychiatric:         Mood and Affect: Mood normal.        I/O:  I/O last 24 hours:    Intake/Output Summary (Last 24 hours) at 05/30/2023 1423  Last data filed at 05/30/2023 1331  Gross per 24 hour   Intake 720 ml   Output --   Net 720 ml     I/O current shift:  03/03 0700 - 03/03 1859  In: 240 [P.O.:240]  Out: -     Labs  Please indicate ordered or reviewed)  Reviewed: I have reviewed all lab results.    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  Aricept 10 mg nightly    Placement on DC    Promise Bushong, DO      DVT/PE Prophylaxis: Heparin    Disposition Planning: Home discharge      Advance Care  Planning Discussed:  No

## 2023-05-31 MED ORDER — FUROSEMIDE 10 MG/ML INJECTION SOLUTION
20.0000 mg | Freq: Once | INTRAMUSCULAR | Status: AC
Start: 2023-05-31 — End: 2023-05-31
  Administered 2023-05-31: 20 mg via INTRAVENOUS
  Filled 2023-05-31: qty 4

## 2023-05-31 NOTE — PT Treatment (Signed)
 [facility]  [address]  [place], [address]  [phone]  (Fax) [phone]  Rehabilitation Department  Physical Therapy Daily Inpatient Note    Date: 05/31/2023  Patient's Name: Tanner [name]  Date of Birth: [birth year]  Height: Height: 182.9 cm (6' 0.01")  Weight: Weight: 91.4 kg (201 lb 9.6 oz)      Plan: Will continue under current POC.      Discharge Disposition: (P) home with assist, home with home health        Subjective/Objective/Assessment:  Flowsheet    05/31/23 0822   Rehab Session   Document Type therapy progress note (daily note)   PT Visit Date 05/31/23   General Information   Patient Profile Reviewed yes   Medical Lines Telemetry   Respiratory Status room air   Existing Precautions/Restrictions fall precautions;droplet isolation   Pre Treatment Status   Pre Treatment Patient Status Other (See comments)  (patient indep in restroom upon entering room)   Support Present Pre Treatment  None   Scientist, water quality   Communication Pre Treatment Comment cleared for PT   Pre- Treatment Vital Signs   Pre SpO2 (%) 96   O2 Delivery Pre Treatment room air   Vitals Comment not connected, but reconnected to get vitals   Pre-Treatment Pain   Pretreatment Pain Rating 0/10 - no pain   Transfer Assessment/Treatment   Sit-Stand [place] stand-by assistance   Stand-Sit [place] stand-by assistance   Sit-Stand-Sit, Assist Device walker, front wheeled   Transfer Safety Issues step length decreased   Transfer Impairments balance impaired   Transfer Comment patient was SBA for assessing his balance, but for safety CGA is appropriate. He braces back of legs to bed to control balance upon standing from EOB.   Gait Assessment/Treatment   Total Distance Ambulated 68   [place]  stand-by assistance   Assistive Device  walker, front wheeled   Gait Speed slow   Deviations  cadence decreased;step length decreased;stride length decreased  (WBOS with gait)    Safety Issues  step length decreased  (will step outside base of RW during sidestepping)   Impairments  balance impaired   Comment amb with WBOS   Balance   Sitting Balance: Static good balance   Sitting, Dynamic (Balance) good balance   Sit-to-Stand Balance fair balance   Standing Balance: Static fair balance   Standing Balance: Dynamic fair balance   Therapeutic Exercise   Comment performed STS x10 EOB to RW SBA, sidestepping 2ft x2 with RW SBA, bridging x12, sidelying clamshell and reverse clamshell bilat 2x12 ea, static standing with WBOS and NBOS accepting light perturbation all directions. He does not have LOB during session, but muscles of feet and ankle are working to prevent retro pulse.   Post Treatment Status   Post Treatment Patient Status Patient sitting in bedside chair or w/c;Call light within reach;Telephone within reach;Patient safety alarm activated   Support Present Post Treatment  None   Communication Post Treatement Charge Nurse;Nurse   Communication Post Treatment Comment asking if patient is still considered a fall risk as his board indicates, but he has no fall risk braclet don nor does he have chair check alarm in vascular chair where he had been sitting prior to indep going to restroom where he was upon PTA entering room. Nursing states he is in fact to be fall risk. Returned to patient room and placed chair check pad in vascular chair and educated patient this  is for his safety. He is receptive, but disappointed as he states he had difficulty using urnal.   Patient Effort good   Post-Treatment Vital Signs   Post-treatment Heart Rate (beats/min) 67   Post SpO2 (%) 95   O2 Delivery Post Treatment room air   Cognitive Assessment/Intervention   Behavior/Mood Observations behavior appropriate to situation, WNL/WFL   Physical Therapy Time and Intention   Total PT Minutes: 50   Therapy Plan Review/Discharge Plan (PT)   Anticipated Discharge Disposition home with assist;home with home health      A: Patient's board indicates fall risk, however, he has no chair check activated and is indep amb to restroom with RW. Discussed fall risk status with nursing and was told he is in fact fall risk status. Chair check was placed in vascular chair and activated.           Goals:                stand-by assistance  walker, rolling  150 feet  decreased BOS, increased knee flexion during swign through         all transfers  supervision required  walker, rolling, least restrictive assistive device  with inproved control and safety                  Intervention minutes: THERAPEUTIC EXERCISE 50 minutes (gait time included)    THERAPIST  Tarri Abernethy, PTA  05/31/2023, 13:12

## 2023-05-31 NOTE — Nurses Notes (Signed)
 [facility]  [address]  [place], [address]  [phone]  (Fax) [phone]  Rehabilitation Department  Physical Therapy    Restorative Aide Note    Date: 05/31/2023  Patient's Name: Tanner [name]  Date of Birth: 1940/11/25  Height: Height: 182.9 cm (6' 0.01")  Weight: Weight: 91.4 kg (201 lb 9.6 oz)      Total minutes: 15    Patient effort: good    Patient profile reviewed: yes    Medical Lines: TELEMETRY    Respiratory Status:ROOM AIR    Existing precautions/restrictions:FALL PRECAUTIONS    Pre-treatment status: PATIENT IN BEDSIDE CHAIR    Communication pre-treatment: CHARGE NURSE    Attention/Behavior: WNL/WFL    Pre-treatment HR: 88    Post-treatment HR: 86    PreSpO2: 97    O2 delivery pre-treatment: n/a    Post SpO2: 97    Pre-treatment pain rating: 0/10    Post-treatment pain rating: 0/10    Location of pain: n/a    Bed mobility (roll, bridge, scoot, supine to sit): up in a vascular chair    Sitting assistance:  SBA/CGA    Sit to stand: SBA/CGA    Gait: SBA/CGA ambulated patient in the room 60 feet with wheeled walker, gait belt and one assit/patient is on isolation covid precautions,       Post treatment status: IN VASCULAR CHAIR, NEEDS IN REACH, BED ALARMED          Annamaria Helling, PAT CARE [facility]  05/31/2023, 15:39

## 2023-05-31 NOTE — Progress Notes (Signed)
 [place] MEDICINE [facility]  [facility]  IP PROGRESS NOTE      Tanner [name], Tanner [name]  Date of Admission:  05/25/2023  Date of Birth:  12/05/1940  Date of Service:  05/31/2023    Hospital Day:  LOS: 3 days     Subjective:   Patient seen examined for follow-up of acute respiratory failure with hypoxia, COVID-19, generalized weakness.    2/28:  reports improvement since yesterday, on room air with oxygen sat in 90's.    3/1:  agitated this AM, did not sleep well last night    3/3:  no new complaints    3/4:  no new complaints    Vital Signs:  Temp (24hrs) Max:36.6 C (97.9 F)      Temperature: 36.4 C (97.5 F)  BP (Non-Invasive): (!) 165/78  MAP (Non-Invasive): 87 mmHG  Heart Rate: 59  Respiratory Rate: 17  SpO2: 98 %    Current Medications:  acetaminophen (TYLENOL) tablet, 650 mg, Oral, Q4H PRN  aluminum-magnesium hydroxide-simethicone (MAG-AL PLUS) 200-200-20 mg per 5 mL oral liquid, 30 mL, Oral, Q4H PRN  arformoterol (BROVANA) 15 mcg/2 mL nebulizer solution, 15 mcg, Nebulization, 2x/day  atorvastatin (LIPITOR) tablet, 80 mg, Oral, NIGHTLY  bromocriptine (PARLODEL) tablet, 2.5 mg, Oral, Daily  budesonide (PULMICORT RESPULES) 0.5 mg/2 mL nebulizer suspension, 1 mg, Nebulization, 2x/day  dextromethorphan-guaiFENesin (ROBITUSSIN DM) 10-100mg  per 5mL oral liquid, 10 mL, Oral, Q4H PRN  diphenhydrAMINE (BENADRYL) capsule, 25 mg, Oral, HS PRN  donepeziL (ARICEPT) tablet 22.5 mg, 22.5 mg, Oral, NIGHTLY  heparin 5,000 unit/mL injection, 5,000 Units, Subcutaneous, Q8HRS  ipratropium-albuterol 0.5 mg-3 mg(2.5 mg base)/3 mL Solution for Nebulization, 3 mL, Nebulization, Q4H PRN  levothyroxine (SYNTHROID) tablet, 137 mcg, Oral, Daily  losartan (COZAAR) tablet, 25 mg, Oral, Daily  metoprolol succinate (TOPROL-XL) 24 hr extended release tablet, 25 mg, Oral, Daily  NS flush syringe, 3 mL, Intracatheter, Q8HRS  NS flush syringe, 3 mL, Intracatheter, Q1H PRN  ondansetron (ZOFRAN) 2 mg/mL injection, 4 mg,  Intravenous, Q6H PRN  pantoprazole (PROTONIX) delayed release tablet, 40 mg, Oral, Daily  revefenacin (YUPELRI) 175 mcg/3 mL nebulizer solution, 175 mcg, Nebulization, Daily  tamsulosin (FLOMAX) capsule, 0.4 mg, Oral, Daily after Dinner  torsemide (DEMADEX) tablet, 10 mg, Oral, Daily        Current Orders:  Active Orders   Diet    DIET CARDIAC (2G NA, LOWFAT, LOW CHOL) Do you want to initiate MNT Protocol? Yes; Restrict fluids to: 1800 ML     Frequency: All Meals     Number of Occurrences: 1 Occurrences   Nursing    ACTIVITY     Frequency: UNTIL DISCONTINUED     Number of Occurrences: Until Specified    INTAKE AND OUTPUT QSHIFT     Frequency: QSHIFT     Number of Occurrences: Until Specified    MISCELLANEOUS MD/DO TO NURSE     Frequency: UNTIL DISCONTINUED     Number of Occurrences: Until Specified     Order Comments: Please confirm home medications and let hospitalist know when this is done so that they can be restarted.      Notify MD Vital Signs     Frequency: PRN     Number of Occurrences: Until Specified    NURSE TO ENTER SECONDARY ORDER Other - (specify in comments) (CHEST PAIN AND/OR ARRYTHMIA)     Frequency: UNTIL DISCONTINUED     Number of Occurrences: Until Specified    PT IS HIGH RISK FOR VENOUS  THROMBOEMBOLISM     Frequency: CONTINUOUS     Number of Occurrences: Until Specified    PULSE OXIMETRY CONTINUOUS     Frequency: CONTINUOUS     Number of Occurrences: Until Specified    TELEMETRY MONITORING - Continuous     Frequency: CONTINUOUS     Number of Occurrences: Until Specified    VITAL SIGNS  Q4H     Frequency: Q4H     Number of Occurrences: Until Specified   Code Status    FULL CODE: ATTEMPT RESUSCITATION / CPR     Frequency: CONTINUOUS     Number of Occurrences: Until Specified     Order Comments: Patient wishes for full ICU level care including advanced airway interventions / mechanical ventilation.     In the event of pulseless cardiac arrest, patient consents to ACLS (advanced cardiac life support)  to attempt resuscitation.  IE - Consents to chest compressions, life support including intubation, mechanical ventilation, defibrillation/cardioversion as indicated.         Consult    IP CONSULT TO CARE MANAGEMENT     Frequency: ONE TIME     Number of Occurrences: 1 Occurrences     Order Comments: Generalized weakness, difficulty ambulating, may need rehab or outpatient servicers      IP CONSULT TO PSYCHIATRY On-Call Provider (nurse/clerk to determine)     Frequency: ONE TIME     Number of Occurrences: 1 Occurrences   Isolation    ENHANCED DROPLET ISOLATION     Frequency: CONTINUOUS     Number of Occurrences: Until Specified     Order Comments: Private room  Wear a fitted N95/MSA/CAPR when entering room  Wear gown, gloves, and eye protection as indicated  Prior to transport, notify receiving department of precautions  Prior to transport, ensure patient is wearing a mask and follows Respiratory Hygiene/Cough Etiquette         OT    OT EVAL & TREAT     Frequency: Per Therapist Discretion     Number of Occurrences: 1 Occurrences     Scheduling Instructions:               PT    PT EVALUATE AND TREAT     Frequency: Per Therapist Discretion     Number of Occurrences: 1 Occurrences     Order Comments: If patient's O2 level drops, PT may increase O2 until sats are > 93%.       Scheduling Instructions:               Respiratory Care    ACCUPAP SYSTEM     Frequency: Q4H WHILE AWAKE     Number of Occurrences: Until Specified    OXYGEN - NASAL CANNULA     Frequency: CONTINUOUS     Number of Occurrences: Until Specified     Order Comments: Flowrate should not exeed 6L/min except [facility] facility.     Titrate oxygen to keep sat 90-92%     IV    INSERT & MAINTAIN PERIPHERAL IV ACCESS     Frequency: UNTIL DISCONTINUED     Number of Occurrences: Until Specified    PERIPHERAL IV DRESSING CHANGE     Frequency: PRN     Number of Occurrences: Until Specified   Discharge    DISCHARGE PATIENT     Frequency: ONE TIME     Number of  Occurrences: 1 Occurrences     Order Comments: [facility] to [facility] when bed available  Medications    acetaminophen (TYLENOL) tablet     Frequency: Q4H PRN     Dose: 650 mg     Route: Oral    aluminum-magnesium hydroxide-simethicone (MAG-AL PLUS) 200-200-20 mg per 5 mL oral liquid     Frequency: Q4H PRN     Dose: 30 mL     Route: Oral    arformoterol (BROVANA) 15 mcg/2 mL nebulizer solution     Frequency: 2x/day     Dose: 15 mcg     Route: Nebulization    atorvastatin (LIPITOR) tablet     Frequency: NIGHTLY     Dose: 80 mg     Route: Oral    bromocriptine (PARLODEL) tablet     Frequency: Daily     Dose: 2.5 mg     Route: Oral    budesonide (PULMICORT RESPULES) 0.5 mg/2 mL nebulizer suspension     Frequency: 2x/day     Dose: 1 mg     Route: Nebulization    dextromethorphan-guaiFENesin (ROBITUSSIN DM) 10-100mg  per 5mL oral liquid     Frequency: Q4H PRN     Dose: 10 mL     Route: Oral    diphenhydrAMINE (BENADRYL) capsule     Frequency: HS PRN     Dose: 25 mg     Route: Oral    donepeziL (ARICEPT) tablet 22.5 mg     Frequency: NIGHTLY     Dose: 22.5 mg     Route: Oral    heparin 5,000 unit/mL injection     Frequency: Q8HRS     Dose: 5,000 Units     Route: Subcutaneous    ipratropium-albuterol 0.5 mg-3 mg(2.5 mg base)/3 mL Solution for Nebulization     Frequency: Q4H PRN     Dose: 3 mL     Route: Nebulization    levothyroxine (SYNTHROID) tablet     Frequency: Daily     Dose: 137 mcg     Route: Oral    losartan (COZAAR) tablet     Frequency: Daily     Dose: 25 mg     Route: Oral    metoprolol succinate (TOPROL-XL) 24 hr extended release tablet     Frequency: Daily     Dose: 25 mg     Route: Oral    NS flush syringe     Frequency: Q8HRS     Dose: 3 mL     Route: Intracatheter    NS flush syringe     Frequency: Q1H PRN     Dose: 3 mL     Route: Intracatheter    ondansetron (ZOFRAN) 2 mg/mL injection     Frequency: Q6H PRN     Dose: 4 mg     Route: Intravenous    pantoprazole (PROTONIX) delayed release tablet     Frequency:  Daily     Dose: 40 mg     Route: Oral    revefenacin (YUPELRI) 175 mcg/3 mL nebulizer solution     Frequency: Daily     Dose: 175 mcg     Route: Nebulization    tamsulosin (FLOMAX) capsule     Frequency: Daily after Dinner     Dose: 0.4 mg     Route: Oral    torsemide (DEMADEX) tablet     Frequency: Daily     Dose: 10 mg     Route: Oral        Review of Systems:  Focused review of system was completed. Refer to  the HPI for ROS details.     Today's Physical Exam:  Physical Exam  Cardiovascular:      Rate and Rhythm: Normal rate and regular rhythm.   Pulmonary:      Breath sounds: Rhonchi present.   Abdominal:      General: Abdomen is flat.      Palpations: Abdomen is soft.   Skin:     General: Skin is warm and dry.   Neurological:      Mental Status: He is alert and oriented to person, place, and time.   Psychiatric:         Mood and Affect: Mood normal.        I/O:  I/O last 24 hours:    Intake/Output Summary (Last 24 hours) at 05/31/2023 1116  Last data filed at 05/31/2023 0400  Gross per 24 hour   Intake 720 ml   Output --   Net 720 ml     I/O current shift:  No intake/output data recorded.    Labs  Please indicate ordered or reviewed)  Reviewed: I have reviewed all lab results.    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  Aricept 10 mg nightly    Placement on DC    Romona Murdy, DO      DVT/PE Prophylaxis:  Heparin    Disposition Planning: rehab facility    Advance Care Planning Discussed:  No

## 2023-06-01 ENCOUNTER — Other Ambulatory Visit (HOSPITAL_COMMUNITY)
Admit: 2023-06-01 | Discharge: 2023-06-01 | Disposition: A | Discharge status: Home / Self Care | Payer: Self-pay | Admitting: EXTERNAL

## 2023-06-01 NOTE — Nurses Notes (Signed)
Report called to [facility]

## 2023-06-01 NOTE — PT Treatment (Signed)
 [facility] Medicine [facility]  [address]  [place], [address]  [phone]  (Fax) [phone]  Rehabilitation Department  Physical Therapy Daily Inpatient Note    Date: 06/01/2023  Patient's Name: Tanner [name]  Date of Birth: [birth year]  Height: Height: 182.9 cm (6' 0.01")  Weight: Weight: 91.4 kg (201 lb 9.6 oz)      Plan: Will continue under current POC.      Discharge Disposition: (P) home with assist, home with 24/7 assistance, inpatient rehabilitation facility        Subjective/Objective/Assessment:  Flowsheet    06/01/23 0906   Rehab Session   Document Type therapy progress note (daily note)   PT Visit Date 06/01/23   General Information   Patient Profile Reviewed yes   Medical Lines Telemetry   Respiratory Status room air   Existing Precautions/Restrictions droplet isolation;fall precautions   Pre Treatment Status   Pre Treatment Patient Status Patient sitting in bedside chair or w/c;Call light within reach;Telephone within reach;Patient safety alarm activated   Support Present Pre Treatment  None   Communication Pre Treatment  Charge Nurse   Communication Pre Treatment Comment cleared for PT   Pre- Treatment Vital Signs   Pre-Treatment Heart Rate (beats/min) 69   Pre SpO2 (%) 96   O2 Delivery Pre Treatment room air   Pre-Treatment Pain   Pretreatment Pain Rating 0/10 - no pain   Transfer Assessment/Treatment   Sit-Stand [place] stand-by assistance   Stand-Sit Independence stand-by assistance   Sit-Stand-Sit, Assist Device   (RW without cues, when performing STS he was able to perform with hands to knees)   Toilet Transfer Assist Device walker, front wheeled   Transfer Safety Issues step length decreased   Transfer Impairments strength decreased;endurance  (he does not exhibit LOB, but potential remains present)   Gait Assessment/Treatment   Total Distance Ambulated 48   Independence  contact guard assist   Assistive Device  walker, front wheeled   Deviations  cadence  decreased;step length decreased;stride length decreased   Safety Issues  step length decreased   Impairments  strength decreased;endurance   Comment Amb 81ft fwd with RW, then 42ft sidestepping with RW. He continues to amb with WBOS and when sidestepping he is challenged as no LOB occurs, but he is balance is not sound.   Balance   Sitting Balance: Static normal balance   Sitting, Dynamic (Balance) good balance   Sit-to-Stand Balance fair + balance   Standing Balance: Static fair + balance   Standing Balance: Dynamic fair + balance   Therapeutic Exercise   Comment Performed x10 STS from EOB with hand on knees, bridges and SLR; 2x10 bilat sidelying hip abd, clamshell and reverse clamshell. Quad lag present with SLR and sidelying hip abd, but able to correct with VC.   Post Treatment Status   Post Treatment Patient Status Patient sitting in bedside chair or w/c;Call light within reach;Telephone within reach;Patient safety alarm activated   Support Present Post Treatment  None   Patient Effort good   Post-Treatment Vital Signs   Post-treatment Heart Rate (beats/min) 74   Post SpO2 (%) 97   O2 Delivery Post Treatment room air   Cognitive Assessment/Intervention   Behavior/Mood Observations behavior appropriate to situation, WNL/WFL;cooperative   Physical Therapy Time and Intention   Total PT Minutes: 25   Therapy Plan Review/Discharge Plan (PT)   Anticipated Discharge Disposition home with assist;home with 24/7 assistance;inpatient rehabilitation facility     A: During exercises  L>R LE weakness note. During sidelying hip abd and reverse clamshell he is unable to perform maintaining neutral plane d/t weakness.           Goals:                stand-by assistance  walker, rolling  150 feet  decreased BOS, increased knee flexion during swign through         all transfers  supervision required  walker, rolling, least restrictive assistive device  with inproved control and safety                  Intervention minutes:  THERAPEUTIC EXERCISE 25 minutes(gait time included)    THERAPIST  Tarri Abernethy, PTA  06/01/2023, 11:45

## 2023-06-01 NOTE — Care Management Notes (Signed)
 Patient approved for IPR placement at [facility] today after 4:00 PM

## 2023-06-01 NOTE — OT Treatment (Signed)
 [facility] Medicine [facility]  [address]  [place], [address]  [phone]  (Fax) [phone]  Rehabilitation Department     Occupational Therapy Daily Inpatient Note    Date: 06/01/2023  Patient's Name: Tanner [name]  Date of Birth: 13-Jan-1941  Height: Height: 182.9 cm (6' 0.01")  Weight: Weight: 91.4 kg (201 lb 9.6 oz)        Plan: Will continue under current POC.         Subjective/Objective/Assessment:      06/01/23 1020   Rehab Session   Document Type therapy progress note (daily note)   OT Visit Date 06/01/23   Total OT Minutes: 15   Patient Effort good   General Information   Patient Profile Reviewed yes   Medical Lines Telemetry   Respiratory Status room air   Existing Precautions/Restrictions droplet isolation;fall precautions   Pre Treatment Status   Pre Treatment Patient Status Patient sitting in bedside chair or w/c   Support Present Pre Treatment  None   Communication Pre Treatment  Charge Nurse   Communication Pre Treatment Comment cleared OT   Vital Signs   Pre-Treatment Heart Rate (beats/min) 70   Post-treatment Heart Rate (beats/min) 71   Pre SpO2 (%) 95   O2 Delivery Pre Treatment room air   Post SpO2 (%) 97   O2 Delivery Post Treatment room air   Pain Assessment   Pretreatment Pain Rating 0/10 - no pain   Posttreatment Pain Rating 0/10 - no pain   Cognition   Behavior/Mood Observations behavior appropriate to situation, WNL/WFL   Transfer Assessment/Treatment   Sit-Stand Independence stand-by assistance   Stand-Sit Independence stand-by assistance   Toilet Transfer Assist Device walker, front wheeled   Upper Body Dressing Assessment/Training   Position  sitting   Independence Level  modified independence   Lower Body Dressing Assessment/Training   Position sitting;standing   DRESSING ASSESSED Doff Underwear;Don Underwear;Don Socks;Doff Socks   Independence Level  independent   Grooming/Oral Hygeine  Assessment/Training   Position standing   Independence Level standby  assist   Post Treatment Status   Post Treatment Patient Status Patient supine in bed;Call light within reach;Patient safety alarm activated   Support Present Post Treatment  None   Clinical Impression   Functional Level at Time of Session patient completed UB dressing with mod I to don/doff hospital gown.  patient able to complete LB dressing to don/doff socks and underwear independently. patient completed grooming task at sink to brush teeth wash hands with NO LOB or fatigue noted by patient. patient completed sit to stand wit FWW with SBA. patient ambulated with SBA. patient left in bedside chair with needs whtin reach and safety alarm on                 Intervention minutes: ADL/IADL TRAINING 15 minutes      THERAPIST  Christ Kick, COTA  06/01/2023, 12:28

## 2023-06-02 ENCOUNTER — Other Ambulatory Visit (HOSPITAL_COMMUNITY): Payer: Self-pay | Admitting: EXTERNAL

## 2023-06-02 LAB — CBC/DIFF - CLIENT CONSOLIDATED
BASOPHIL #: 0 10*3/uL (ref 0.00–0.10)
BASOPHIL %: 0 % (ref 0–1)
EOSINOPHIL #: 0.2 10*3/uL (ref 0.00–0.50)
EOSINOPHIL %: 3 % (ref 1–8)
HCT: 41.3 % (ref 36.7–47.1)
HGB: 13.7 g/dL (ref 12.5–16.3)
LYMPHOCYTE #: 1.7 10*3/uL (ref 1.00–3.20)
LYMPHOCYTE %: 25 % (ref 15–43)
MCH: 30.1 pg (ref 23.8–33.4)
MCHC: 33.2 g/dL (ref 32.5–36.3)
MCV: 90.6 fL (ref 73.0–96.2)
MONOCYTE #: 0.6 10*3/uL (ref 0.30–1.10)
MONOCYTE %: 10 % (ref 6–14)
MPV: 8.1 fL (ref 7.4–11.4)
NEUTROPHIL #: 4 10*3/uL (ref 1.70–7.60)
NEUTROPHIL %: 62 % (ref 44–74)
PLATELETS: 234 10*3/uL (ref 140–440)
RBC: 4.56 10*6/uL (ref 4.06–5.63)
RDW: 14.9 % (ref 12.1–16.2)
WBC: 6.5 10*3/uL
WBC: 6.5 10*3/uL (ref 3.6–10.2)

## 2023-06-02 LAB — COMPREHENSIVE METABOLIC PANEL, NON-FASTING
ALBUMIN/GLOBULIN RATIO: 1.5 — ABNORMAL HIGH (ref 0.8–1.4)
ALBUMIN: 3.8 g/dL (ref 3.5–5.7)
ALKALINE PHOSPHATASE: 94 U/L (ref 34–104)
ALT (SGPT): 27 U/L (ref 7–52)
ANION GAP: 9 mmol/L (ref 4–13)
AST (SGOT): 26 U/L (ref 13–39)
BILIRUBIN TOTAL: 1.1 mg/dL — ABNORMAL HIGH (ref 0.3–1.0)
BUN/CREA RATIO: 18 (ref 6–22)
BUN: 19 mg/dL (ref 7–25)
CALCIUM, CORRECTED: 9.7 mg/dL (ref 8.9–10.8)
CALCIUM: 9.5 mg/dL (ref 8.6–10.3)
CHLORIDE: 105 mmol/L (ref 98–107)
CO2 TOTAL: 33 mmol/L — ABNORMAL HIGH (ref 21–31)
CREATININE: 1.06 mg/dL (ref 0.60–1.30)
ESTIMATED GFR: 69 mL/min/{1.73_m2} (ref >59)
GLOBULIN: 2.5 (ref 2.0–3.5)
GLUCOSE: 75 mg/dL (ref 74–109)
OSMOLALITY, CALCULATED: 293 mosm/kg — ABNORMAL HIGH (ref 270–290)
POTASSIUM: 3.4 mmol/L — ABNORMAL LOW (ref 3.5–5.1)
PROTEIN TOTAL: 6.3 g/dL — ABNORMAL LOW (ref 6.4–8.9)
SODIUM: 147 mmol/L — ABNORMAL HIGH (ref 136–145)

## 2023-06-02 LAB — CBC WITH DIFF
BASOPHIL #: 0 10*3/uL (ref 0.00–0.10)
BASOPHIL %: 0 % (ref 0–1)
EOSINOPHIL #: 0.2 10*3/uL (ref 0.00–0.50)
EOSINOPHIL %: 3 % (ref 1–8)
HCT: 41.3 % (ref 36.7–47.1)
HGB: 13.7 g/dL (ref 12.5–16.3)
LYMPHOCYTE #: 1.7 10*3/uL (ref 1.00–3.20)
LYMPHOCYTE %: 25 % (ref 15–43)
MCH: 30.1 pg (ref 23.8–33.4)
MCHC: 33.2 g/dL (ref 32.5–36.3)
MCV: 90.6 fL (ref 73.0–96.2)
MONOCYTE #: 0.6 10*3/uL (ref 0.30–1.10)
MONOCYTE %: 10 % (ref 6–14)
MPV: 8.1 fL (ref 7.4–11.4)
NEUTROPHIL #: 4 10*3/uL (ref 1.70–7.60)
NEUTROPHIL %: 62 % (ref 44–74)
PLATELETS: 234 10*3/uL (ref 140–440)
RBC: 4.56 10*6/uL (ref 4.06–5.63)
RDW: 14.9 % (ref 12.1–16.2)
WBC: 6.5 10*3/uL (ref 3.6–10.2)

## 2023-06-02 NOTE — Care Management Notes (Signed)
 Referral Information  ++++++ Placed Provider #1 ++++++  Case Manager: Clarisa Schools  Provider Type: Acute -Rehab  Provider Name: [facility]  Address:  [address]  [place], [place] [id number]  Contact: [place] [facility]    Phone: [phone]  Fax:   Fax: [phone]

## 2023-06-03 ENCOUNTER — Other Ambulatory Visit (HOSPITAL_COMMUNITY): Payer: Self-pay | Admitting: EXTERNAL

## 2023-06-03 LAB — CBC WITH DIFF
BASOPHIL #: 0 10*3/uL (ref 0.00–0.10)
BASOPHIL %: 0 % (ref 0–1)
EOSINOPHIL #: 0.2 10*3/uL (ref 0.00–0.50)
EOSINOPHIL %: 3 % (ref 1–8)
HCT: 39.4 % (ref 36.7–47.1)
HGB: 13.2 g/dL (ref 12.5–16.3)
LYMPHOCYTE #: 1.6 10*3/uL (ref 1.00–3.20)
LYMPHOCYTE %: 22 % (ref 15–43)
MCH: 30.6 pg (ref 23.8–33.4)
MCHC: 33.6 g/dL (ref 32.5–36.3)
MCV: 91.2 fL (ref 73.0–96.2)
MONOCYTE #: 0.8 10*3/uL (ref 0.30–1.10)
MONOCYTE %: 10 % (ref 6–14)
MPV: 7.8 fL (ref 7.4–11.4)
NEUTROPHIL #: 4.8 10*3/uL (ref 1.70–7.60)
NEUTROPHIL %: 65 % (ref 44–74)
PLATELETS: 213 10*3/uL (ref 140–440)
RBC: 4.32 10*6/uL (ref 4.06–5.63)
RDW: 14.5 % (ref 12.1–16.2)
WBC: 7.4 10*3/uL (ref 3.6–10.2)

## 2023-06-03 LAB — CBC/DIFF - CLIENT CONSOLIDATED
BASOPHIL #: 0 10*3/uL (ref 0.00–0.10)
BASOPHIL %: 0 % (ref 0–1)
EOSINOPHIL #: 0.2 10*3/uL (ref 0.00–0.50)
EOSINOPHIL %: 3 % (ref 1–8)
HCT: 39.4 % (ref 36.7–47.1)
HGB: 13.2 g/dL (ref 12.5–16.3)
LYMPHOCYTE #: 1.6 10*3/uL (ref 1.00–3.20)
LYMPHOCYTE %: 22 % (ref 15–43)
MCH: 30.6 pg (ref 23.8–33.4)
MCHC: 33.6 g/dL (ref 32.5–36.3)
MCV: 91.2 fL (ref 73.0–96.2)
MONOCYTE #: 0.8 10*3/uL (ref 0.30–1.10)
MONOCYTE %: 10 % (ref 6–14)
MPV: 7.8 fL (ref 7.4–11.4)
NEUTROPHIL #: 4.8 10*3/uL (ref 1.70–7.60)
NEUTROPHIL %: 65 % (ref 44–74)
PLATELETS: 213 10*3/uL (ref 140–440)
RBC: 4.32 10*6/uL (ref 4.06–5.63)
RDW: 14.5 % (ref 12.1–16.2)
WBC: 7.4 10*3/uL
WBC: 7.4 10*3/uL (ref 3.6–10.2)

## 2023-06-03 LAB — COMPREHENSIVE METABOLIC PANEL, NON-FASTING
ALBUMIN/GLOBULIN RATIO: 1.5 — ABNORMAL HIGH (ref 0.8–1.4)
ALBUMIN: 3.6 g/dL (ref 3.5–5.7)
ALKALINE PHOSPHATASE: 89 U/L (ref 34–104)
ALT (SGPT): 27 U/L (ref 7–52)
ANION GAP: 6 mmol/L (ref 4–13)
AST (SGOT): 24 U/L (ref 13–39)
BILIRUBIN TOTAL: 1 mg/dL (ref 0.3–1.0)
BUN/CREA RATIO: 21 (ref 6–22)
BUN: 21 mg/dL (ref 7–25)
CALCIUM, CORRECTED: 9.7 mg/dL (ref 8.9–10.8)
CALCIUM: 9.4 mg/dL (ref 8.6–10.3)
CHLORIDE: 104 mmol/L (ref 98–107)
CO2 TOTAL: 30 mmol/L (ref 21–31)
CREATININE: 1.02 mg/dL (ref 0.60–1.30)
ESTIMATED GFR: 72 mL/min/{1.73_m2} (ref >59)
GLOBULIN: 2.4 (ref 2.0–3.5)
GLUCOSE: 94 mg/dL (ref 74–109)
OSMOLALITY, CALCULATED: 282 mosm/kg (ref 270–290)
POTASSIUM: 3.3 mmol/L — ABNORMAL LOW (ref 3.5–5.1)
PROTEIN TOTAL: 6 g/dL — ABNORMAL LOW (ref 6.4–8.9)
SODIUM: 140 mmol/L (ref 136–145)

## 2023-06-06 ENCOUNTER — Encounter (INDEPENDENT_AMBULATORY_CARE_PROVIDER_SITE_OTHER): Payer: 59 | Admitting: NEUROLOGY

## 2023-06-07 ENCOUNTER — Other Ambulatory Visit (HOSPITAL_COMMUNITY): Payer: Self-pay | Admitting: EXTERNAL

## 2023-06-07 LAB — CBC WITH DIFF
BASOPHIL #: 0 10*3/uL (ref 0.00–0.10)
BASOPHIL %: 0 % (ref 0–1)
EOSINOPHIL #: 0.1 10*3/uL (ref 0.00–0.50)
EOSINOPHIL %: 2 % (ref 1–8)
HCT: 36.8 % (ref 36.7–47.1)
HGB: 12.4 g/dL — ABNORMAL LOW (ref 12.5–16.3)
LYMPHOCYTE #: 1.6 10*3/uL (ref 1.00–3.20)
LYMPHOCYTE %: 24 % (ref 15–43)
MCH: 30.5 pg (ref 23.8–33.4)
MCHC: 33.6 g/dL (ref 32.5–36.3)
MCV: 90.9 fL (ref 73.0–96.2)
MONOCYTE #: 0.5 10*3/uL (ref 0.30–1.10)
MONOCYTE %: 8 % (ref 6–14)
MPV: 8.6 fL (ref 7.4–11.4)
NEUTROPHIL #: 4.3 10*3/uL (ref 1.70–7.60)
NEUTROPHIL %: 66 % (ref 44–74)
PLATELETS: 197 10*3/uL (ref 140–440)
RBC: 4.05 10*6/uL — ABNORMAL LOW (ref 4.06–5.63)
RDW: 14.8 % (ref 12.1–16.2)
WBC: 6.5 10*3/uL (ref 3.6–10.2)

## 2023-06-07 LAB — COMPREHENSIVE METABOLIC PANEL, NON-FASTING
ALBUMIN/GLOBULIN RATIO: 1.4 (ref 0.8–1.4)
ALBUMIN: 3.4 g/dL — ABNORMAL LOW (ref 3.5–5.7)
ALKALINE PHOSPHATASE: 85 U/L (ref 34–104)
ALT (SGPT): 32 U/L (ref 7–52)
ANION GAP: 8 mmol/L (ref 4–13)
AST (SGOT): 58 U/L — ABNORMAL HIGH (ref 13–39)
BILIRUBIN TOTAL: 0.5 mg/dL (ref 0.3–1.0)
BUN/CREA RATIO: 21 (ref 6–22)
BUN: 21 mg/dL (ref 7–25)
CALCIUM, CORRECTED: 9.7 mg/dL (ref 8.9–10.8)
CALCIUM: 9.2 mg/dL (ref 8.6–10.3)
CHLORIDE: 106 mmol/L (ref 98–107)
CO2 TOTAL: 27 mmol/L (ref 21–31)
CREATININE: 1.01 mg/dL (ref 0.60–1.30)
ESTIMATED GFR: 73 mL/min/{1.73_m2} (ref >59)
GLOBULIN: 2.5 (ref 2.0–3.5)
GLUCOSE: 97 mg/dL (ref 74–109)
OSMOLALITY, CALCULATED: 284 mosm/kg (ref 270–290)
POTASSIUM: 3.7 mmol/L (ref 3.5–5.1)
PROTEIN TOTAL: 5.9 g/dL — ABNORMAL LOW (ref 6.4–8.9)
SODIUM: 141 mmol/L (ref 136–145)

## 2023-06-07 LAB — CBC/DIFF - CLIENT CONSOLIDATED
BASOPHIL #: 0 10*3/uL (ref 0.00–0.10)
BASOPHIL %: 0 % (ref 0–1)
EOSINOPHIL #: 0.1 10*3/uL (ref 0.00–0.50)
EOSINOPHIL %: 2 % (ref 1–8)
HCT: 36.8 % (ref 36.7–47.1)
HGB: 12.4 g/dL — ABNORMAL LOW (ref 12.5–16.3)
LYMPHOCYTE #: 1.6 10*3/uL (ref 1.00–3.20)
LYMPHOCYTE %: 24 % (ref 15–43)
MCH: 30.5 pg (ref 23.8–33.4)
MCHC: 33.6 g/dL (ref 32.5–36.3)
MCV: 90.9 fL (ref 73.0–96.2)
MONOCYTE #: 0.5 10*3/uL (ref 0.30–1.10)
MONOCYTE %: 8 % (ref 6–14)
MPV: 8.6 fL (ref 7.4–11.4)
NEUTROPHIL #: 4.3 10*3/uL (ref 1.70–7.60)
NEUTROPHIL %: 66 % (ref 44–74)
PLATELETS: 197 10*3/uL (ref 140–440)
RBC: 4.05 10*6/uL — ABNORMAL LOW (ref 4.06–5.63)
RDW: 14.8 % (ref 12.1–16.2)
WBC: 6.5 10*3/uL
WBC: 6.5 10*3/uL (ref 3.6–10.2)

## 2023-06-23 ENCOUNTER — Ambulatory Visit (HOSPITAL_COMMUNITY): Payer: Self-pay

## 2023-06-30 ENCOUNTER — Encounter (INDEPENDENT_AMBULATORY_CARE_PROVIDER_SITE_OTHER): Payer: Self-pay | Admitting: Student in an Organized Health Care Education/Training Program

## 2023-07-11 ENCOUNTER — Encounter (INDEPENDENT_AMBULATORY_CARE_PROVIDER_SITE_OTHER): Payer: Self-pay

## 2023-07-22 ENCOUNTER — Other Ambulatory Visit: Payer: Self-pay

## 2023-07-22 ENCOUNTER — Ambulatory Visit
Admission: RE | Admit: 2023-07-22 | Discharge: 2023-07-22 | Disposition: A | Discharge status: Home / Self Care | Payer: Self-pay | Source: Ambulatory Visit | Attending: Student in an Organized Health Care Education/Training Program | Admitting: Student in an Organized Health Care Education/Training Program

## 2023-07-22 DIAGNOSIS — N2889 Other specified disorders of kidney and ureter: Secondary | ICD-10-CM | POA: Insufficient documentation

## 2023-07-26 ENCOUNTER — Encounter (INDEPENDENT_AMBULATORY_CARE_PROVIDER_SITE_OTHER): Payer: Self-pay | Admitting: NEUROLOGY

## 2023-07-26 ENCOUNTER — Other Ambulatory Visit: Payer: Self-pay

## 2023-07-26 ENCOUNTER — Ambulatory Visit (INDEPENDENT_AMBULATORY_CARE_PROVIDER_SITE_OTHER): Admitting: NEUROLOGY

## 2023-07-26 VITALS — BP 145/85 | HR 68 | Temp 99.6°F | Wt 202.0 lb

## 2023-07-26 DIAGNOSIS — R4189 Other symptoms and signs involving cognitive functions and awareness: Secondary | ICD-10-CM

## 2023-07-26 DIAGNOSIS — R269 Unspecified abnormalities of gait and mobility: Secondary | ICD-10-CM

## 2023-07-26 NOTE — Progress Notes (Signed)
 Assessment & Plan  Gait disturbance  He is an 83 year old man who follows-up for over 1 year history of gait disturbance. He had relatively abrupt onset of difficulty walking that improved slightly but never resolved to baseline. He was taken to the ED but did not have an MRI due to pacemaker implantation. 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 I very much doubt NPH as the etiology of his gait disturbance and prior CT shows ex vacuo dilatation.  We have been unable to. I am most concerned that he suffered a cerebrovascular event that caused an acute decline in his gait. Unfortunately, I have been unable to obtain his prior MRI. His walking his shuffling, however, there is little else to suggest Parkinson's. He could not tolerate Sinemet  trial. There has been no worsening in his gait since last visit. I still think this likely represents a CVA.     - Obtain previous imaging  - continue ASA 81 mg daily  - continue atorvastatin  80 mg daily  - LDL goal <70  - BP <140/80  Cognitive decline  He and wife have noted decline in his short term memory over at least a year or more. 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 We discussed the likely possibility of mild neurocognitive disorder secondary to Alzheimer's disease. He already takes donepezil  and tolerates this well. While CAA would increase the risk of hemorrhage. He requires ASA for history of CAD    - counseled on options for treatment and workup  - continue donepezil  23mg  nightly    RTC 6 months    Thank you for allowing me to participate in your patient's care and please do not hesitate to contact me for any questions or concerns.    S. Zach Benetta Maclaren, DO  Assistant Professor of Neurology  [place]  [facility]     I personally spent a total of 30 minutes today preparing to see the patient, in the encounter with  the patient, and documenting after the visit.    [id number]: I will continue to be the provider focal point in managing the chronic complex neurological condition  ==========================================================================================================================================    NAME:  Tanner [name]  DOB:  11-14-1940  VISIT DATE:  01/17/2023    CC:  gait disturbance    Patient seen in consultation at the request of Dr. Kelleen Patee  History obtained from the patient and chart/records  Age of patient:  83 y.o.    INTERVAL: Since last seen, there has been little change in his gait. He does have occasional falls and tends to be backwards. He trialed Sinemet  though could not tolerate it due to nausea. He and wife have noticed some cognitive decline over a year or more though nothing drastic. He is still able to do finances though it takes him longer than it used to.     HPI:   I had the pleasure of seeing your patient in neurology clinic for an outpatient consultation, who is a 83 y.o. year old male who was referred for evaluation of gait disturbance.  Please allow me to summarize the history for the record.    He is joined by wife who gives additional details. He reports abrupt onset of gait disturbance over a year ago. He was in usual state of health then got out of the car and felt like legs gave  out from under him. He was unable to get up and family took him to ED [facility]). He was unable to have MRI given pacemaker though rest of workup was unremarkable. He was discharged and over the following days his symptoms improved to the point that he could use a walker but have not improved past that point. He has been following with Dr. Donnie Galea. He had an MRI at [facility] showing possible NPH and microhemorrhages. An LP was attempted but not obtained.   He has had some cognitive decline over a number of years and was placed on donepezil .    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)    Low back pain    COVID-19    Generalized weakness    Acute respiratory failure with hypoxia (CMS HCC)     Past Surgical History:   Procedure Laterality Date    CARDIAC PACEMAKER PLACEMENT  10/29/2021    DUAL CHAMBER PACEMAKER INSERTION WITH LEADS    HX APPENDECTOMY      HX CATARACT REMOVAL Bilateral     HX CORONARY ARTERY BYPASS GRAFT      HX HEART SURGERY      open heart x2    HX MITRAL VALVE REPAIR      HX THYROIDECTOMY      LUNG REMOVAL, PARTIAL Right          Family Medical History:       Problem Relation (Age of Onset)    Cancer Mother    Heart Attack Father            Current Outpatient Medications   Medication Sig Dispense Refill    albuterol  sulfate (PROVENTIL  OR VENTOLIN  OR PROAIR ) 90 mcg/actuation Inhalation oral inhaler Take 2 Puffs by inhalation Daily      aspirin  81 mg Oral Tablet, Chewable Chew 1 Tablet (81 mg total) Daily      atorvastatin  (LIPITOR) 80 mg Oral Tablet Take 1 Tablet (80 mg total) by mouth Every night      bromocriptine  (PARLODEL ) 2.5 mg Oral Tablet Take 1 Tablet (2.5 mg total) by mouth Daily      calcium carbonate-vitamin D3 600 mg-10 mcg (400 unit) Oral Tablet Take 2 Tablets by mouth Daily      diphenhydrAMINE  (BENADRYL ) 50 mg Oral Capsule Take 1 Capsule (50 mg total) by mouth Every night      ketotifen fumarate (EYE ITCH RELIEF) 0.025 % (0.035 %) Ophthalmic Drops Instill 1 Drop into both eyes Twice daily      levothyroxine  (SYNTHROID ) 137 mcg Oral Tablet Take 1 Tablet (137 mcg total) by mouth Daily      losartan  (COZAAR ) 50 mg Oral Tablet Take 0.5 Tablets (25 mg total) by mouth Daily      metoprolol  succinate (TOPROL -XL) 25 mg Oral Tablet Sustained Release 24 hr Take 1 Tablet (25 mg total) by mouth  Once a day 90 Tablet 4    omeprazole (PRILOSEC) 20 mg Oral Capsule, Delayed Release(E.C.) Take 1 Capsule (20 mg total) by mouth Daily      polyethylene glycol (MIRALAX) 17 gram/dose Oral Powder TAKE 1 CAPFUL (17GM) BY MOUTH EVERY DAY (MIX POWDER IN 4 TO 8 OUNCES OF FLUID)      tamsulosin  (FLOMAX ) 0.4 mg Oral Capsule Take 1 Capsule (0.4 mg total) by mouth Every evening after dinner 30 Capsule 5  torsemide  (DEMADEX ) 20 mg Oral Tablet Take 0.5 Tablets (10 mg total) by mouth Daily       No current facility-administered medications for this visit.     Allergies   Allergen Reactions    Sulfa (Sulfonamides) Rash and Nausea/ Vomiting     Social History     Socioeconomic History    Marital status: Married     Spouse name: Not on file    Number of children: Not on file    Years of education: Not on file    Highest education level: Not on file   Occupational History    Not on file   Tobacco Use    Smoking status: Former     Current packs/day: 0.00     Average packs/day: 1 pack/day for 48.0 years (48.0 ttl pk-yrs)     Types: Cigarettes     Start date: 74     Quit date: 2003     Years since quitting: 22.3    Smokeless tobacco: Current     Types: Snuff   Vaping Use    Vaping status: Never Used   Substance and Sexual Activity    Alcohol use: Never    Drug use: Never    Sexual activity: Not Currently   Other Topics Concern    Not on file   Social History Narrative    Not on file     Social Determinants of Health     Financial Resource Strain: Low Risk  (10/13/2021)    Financial Resource Strain     SDOH Financial: No   Transportation Needs: Low Risk  (10/13/2021)    Transportation Needs     SDOH Transportation: No   Social Connections: Low Risk  (05/26/2023)    Social Connections     SDOH Social Isolation: 5 or more times a week   Intimate Partner Violence: Low Risk  (10/13/2021)    Intimate Partner Violence     SDOH Domestic Violence: No   Housing Stability: Low Risk  (10/13/2021)    Housing Stability     SDOH Housing Situation: I  have housing.     SDOH Housing Worry: No       ============================================================================================================================================  GENERAL EXAMINATION  BP (!) 145/85 (Site: Left Arm, Patient Position: Sitting)   Pulse 68   Temp 37.6 C (99.6 F) (Temporal)   Wt 91.6 kg (202 lb)   SpO2 94%   BMI 27.39 kg/m     Vital signs personally reviewed  General: No acute distress, alert  HEENT: Normocephalic, no scleral icterus  Extremities: No significant edema, No cyanosis    NEUROLOGIC EXAM  On neurological exam, patient was awake, alert and answering questions appropriately  Speech was fluent, without dysarthria or aphasia.    CN  II-XII: grossly intact    MOTOR  Bulk: normal  Tone: normal  Abnormal Movements: none    Strength:     MRC Grading Scale   Right Left   Deltoid 5 5   Biceps 5 5   Triceps 5 5   Wrist Extension - -   Wrist Flexion - -   Finger Extension - -   Finger Abduction - -   Finger Flexion - -   Hip Flexion 5 5   Hip Extension - -   Hip Abduction - -   Hip Adduction - -   Knee Extension 5 5   Knee Flexion 5 5   Ankle Dorsiflexion - -   Ankle Plantarflexion - -  Toe Extension - -   Toe Flexion - -     REFLEXES   Right Left   Biceps 3 3   Triceps 2 2   Brachioradialis 2 2   Patellar 3 3   Achilles 0 0   Plantar - -   Hoffman - -   Pectoralis - -   Jaw Jerk - -       SENSORY  Light touch: intact throughout    GAIT  General: walks with rolling walker, very wide based, slightly shuffling steps, not clearly magnetic     COORDINATION  Finger nose finger: normal    ================================================================================================================================LABS  Personal Review of prior labs is notable for:   2024  B12 355  A1c 5.9  CMP largely WNL   IMAGING  Personal Review of imaging is notable for:   MRI Brain w/o May 2024 - not available for direct review  IMPRESSION:     No acute intracranial pathology.      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  Recommend correlation with clinical presentation.  Other differential consideration includes central neuronal volume loss.      Age-related neuronal volume loss and microvascular ischemic white matter change.     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   Reviewed reports from MRI cervical and lumbar spine, no significant canal stenosis  OTHER DIAGNOSTICS  Personal Review of other prior diagnostics is notable for: not applicable

## 2023-07-27 ENCOUNTER — Encounter (INDEPENDENT_AMBULATORY_CARE_PROVIDER_SITE_OTHER): Payer: Self-pay | Admitting: NEUROLOGY

## 2023-07-29 ENCOUNTER — Encounter (INDEPENDENT_AMBULATORY_CARE_PROVIDER_SITE_OTHER): Payer: Self-pay | Admitting: Physician Assistant

## 2023-08-02 ENCOUNTER — Other Ambulatory Visit (INDEPENDENT_AMBULATORY_CARE_PROVIDER_SITE_OTHER): Payer: Self-pay | Admitting: Physician Assistant

## 2023-08-02 ENCOUNTER — Ambulatory Visit (INDEPENDENT_AMBULATORY_CARE_PROVIDER_SITE_OTHER): Payer: Self-pay | Admitting: Physician Assistant

## 2023-08-02 DIAGNOSIS — N289 Disorder of kidney and ureter, unspecified: Secondary | ICD-10-CM

## 2024-01-26 ENCOUNTER — Ambulatory Visit (INDEPENDENT_AMBULATORY_CARE_PROVIDER_SITE_OTHER): Payer: Self-pay | Admitting: NEUROLOGY

## 2024-01-26 ENCOUNTER — Other Ambulatory Visit: Payer: Self-pay

## 2024-01-26 ENCOUNTER — Encounter (INDEPENDENT_AMBULATORY_CARE_PROVIDER_SITE_OTHER): Payer: Self-pay | Admitting: NEUROLOGY

## 2024-01-26 VITALS — BP 188/98 | HR 57 | Temp 98.6°F

## 2024-01-26 DIAGNOSIS — R4189 Other symptoms and signs involving cognitive functions and awareness: Secondary | ICD-10-CM

## 2024-01-26 DIAGNOSIS — R269 Unspecified abnormalities of gait and mobility: Secondary | ICD-10-CM

## 2024-01-26 NOTE — Progress Notes (Signed)
 Assessment & Plan  Gait disturbance  He is an 83 year old man who follows-up for over 1 year history of gait disturbance. He had relatively abrupt onset of difficulty walking that improved slightly but never resolved to baseline. He was taken to the ED but did not have an MRI due to pacemaker implantation. 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 I very much doubt NPH as the etiology of his gait disturbance and prior CT shows ex vacuo dilatation.  We have been unable to. I am most concerned that he suffered a cerebrovascular event that caused an acute decline in his gait. Though prior MRI does not support this. His walking his shuffling, however, there is little else to suggest Parkinson's. He could not tolerate Sinemet  trial. There has been no worsening in his gait since last visit. While I think the likelihood is quite low, we will refer to movement disorders for consideration of NPH as otherwise, I do not think that there is another intervention that is likely to improve his function as he has not found benefit with physical therapy.     - referral to movement disorders  - continue ASA 81 mg daily  - continue atorvastatin  80 mg daily  - LDL goal <70  - BP <140/80  Cognitive decline  He and wife have noted decline in his short term memory over at least a year or more. 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 We discussed the likely possibility of mild neurocognitive disorder secondary to Alzheimer's disease. He already takes donepezil  and tolerates this well. While CAA would increase the risk of hemorrhage. He requires ASA for history of CAD     - counseled on options for treatment and workup  - continue donepezil  23mg  nightly    RTC 5-6 months    Thank you for allowing me to participate in your patient's care and please do not hesitate to contact me for any questions or concerns.    S.  Zach Jocelyn Lowery, DO  Assistant Professor of Neurology  [place]  [facility]     I personally spent a total of 30 minutes today preparing to see the patient, in the encounter with the patient, and documenting after the visit.    [id number]: I will continue to be the provider focal point in managing the chronic complex neurological condition  ==========================================================================================================================================    NAME:  Antonie Bole  DOB:  1940/07/25  VISIT DATE:  01/17/2023    CC:  gait disturbance    Patient seen in consultation at the request of Dr. Lonni Maiden  History obtained from the patient and chart/records  Age of patient:  83 y.o.    INTERVAL: Since last seen, walking is perhaps slightly worse. Continues to have falls. Cognition is largely stable. He is tolerating donepezil  without issue. They are interested in any potential intervention that might improve his gait.     HPI:   I had the pleasure of seeing your patient in neurology clinic for an outpatient consultation, who is a 83 y.o. year old male who was referred for evaluation of gait disturbance.  Please allow me to summarize the history for the record.    He is joined by wife who gives additional details. He reports abrupt onset of gait disturbance over a year ago. He was in usual state of health then got out of the  car and felt like legs gave out from under him. He was unable to get up and family took him to ED [facility] He was unable to have MRI given pacemaker though rest of workup was unremarkable. He was discharged and over the following days his symptoms improved to the point that he could use a walker but have not improved past that point. He has been following with Dr. Milissa. He had an MRI at [facility] showing possible NPH and microhemorrhages. An LP was attempted but not obtained.   He has had some cognitive decline over a number of  years and was placed on donepezil .   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)    Low back pain    COVID-19    Generalized weakness    Acute respiratory failure with hypoxia (CMS HCC)     Past Surgical History:   Procedure Laterality Date    CARDIAC PACEMAKER PLACEMENT  10/29/2021    DUAL CHAMBER PACEMAKER INSERTION WITH LEADS    HX APPENDECTOMY      HX CATARACT REMOVAL Bilateral     HX CORONARY ARTERY BYPASS GRAFT      HX HEART SURGERY      open heart x2    HX MITRAL VALVE REPAIR      HX THYROIDECTOMY      LUNG REMOVAL, PARTIAL Right          Family Medical History:       Problem Relation (Age of Onset)    Cancer Mother    Heart Attack Father            Current Outpatient Medications   Medication Sig Dispense Refill    albuterol  sulfate (PROVENTIL  OR VENTOLIN  OR PROAIR ) 90 mcg/actuation Inhalation oral inhaler Take 2 Puffs by inhalation Daily      aspirin  81 mg Oral Tablet, Chewable Chew 1 Tablet (81 mg total) Daily      atorvastatin  (LIPITOR) 80 mg Oral Tablet Take 1 Tablet (80 mg total) by mouth Every night      bromocriptine  (PARLODEL ) 2.5 mg Oral Tablet Take 1 Tablet (2.5 mg total) by mouth Daily      calcium carbonate-vitamin D3 600 mg-10 mcg (400 unit) Oral Tablet Take 2 Tablets by mouth Daily      diphenhydrAMINE  (BENADRYL ) 50 mg Oral Capsule Take 1 Capsule (50 mg total) by mouth Every night      ketotifen fumarate (EYE ITCH RELIEF) 0.025 % (0.035 %) Ophthalmic Drops Instill 1 Drop into both eyes Twice daily      levothyroxine  (SYNTHROID ) 137 mcg Oral Tablet Take 1 Tablet (137 mcg total) by mouth Daily      losartan  (COZAAR ) 50 mg Oral Tablet Take 0.5 Tablets (25 mg total) by mouth Daily      metoprolol  succinate (TOPROL -XL) 25 mg Oral Tablet Sustained Release 24 hr  Take 1 Tablet (25 mg total) by mouth Once a day 90 Tablet 4    omeprazole (PRILOSEC) 20 mg Oral Capsule, Delayed Release(E.C.) Take 1 Capsule (20 mg total) by mouth Daily      polyethylene glycol (MIRALAX) 17 gram/dose Oral Powder TAKE 1 CAPFUL (17GM) BY MOUTH EVERY DAY (MIX POWDER IN 4 TO 8 OUNCES OF FLUID)      tamsulosin  (FLOMAX ) 0.4 mg Oral Capsule Take 1 Capsule (0.4 mg total) by mouth Every evening after  dinner 30 Capsule 5    torsemide  (DEMADEX ) 20 mg Oral Tablet Take 0.5 Tablets (10 mg total) by mouth Daily       No current facility-administered medications for this visit.     Allergies   Allergen Reactions    Sulfa (Sulfonamides) Rash and Nausea/ Vomiting     Social History     Socioeconomic History    Marital status: Married     Spouse name: Not on file    Number of children: Not on file    Years of education: Not on file    Highest education level: Not on file   Occupational History    Not on file   Tobacco Use    Smoking status: Former     Current packs/day: 0.00     Average packs/day: 1 pack/day for 48.0 years (48.0 ttl pk-yrs)     Types: Cigarettes     Start date: 68     Quit date: 2003     Years since quitting: 22.8    Smokeless tobacco: Current     Types: Snuff   Vaping Use    Vaping status: Never Used   Substance and Sexual Activity    Alcohol use: Never    Drug use: Never    Sexual activity: Not Currently   Other Topics Concern    Not on file   Social History Narrative    Not on file     Social Determinants of Health     Financial Resource Strain: Low Risk (10/13/2021)    Financial Resource Strain     SDOH Financial: No   Transportation Needs: Low Risk (10/13/2021)    Transportation Needs     SDOH Transportation: No   Social Connections: Low Risk (05/26/2023)    Social Connections     SDOH Social Isolation: 5 or more times a week   Intimate Partner Violence: Low Risk (10/13/2021)    Intimate Partner Violence     SDOH Domestic Violence: No   Housing Stability: Low Risk (10/13/2021)    Housing Stability      SDOH Housing Situation: I have housing.     SDOH Housing Worry: No       ============================================================================================================================================  GENERAL EXAMINATION  BP (!) 188/98 (Site: Left Arm, Patient Position: Sitting, Cuff Size: Adult)   Pulse 57   Temp 37 C (98.6 F) (Temporal)   SpO2 95%     Vital signs personally reviewed  General: No acute distress, alert  HEENT: Normocephalic, no scleral icterus  Extremities: No significant edema, No cyanosis    NEUROLOGIC EXAM  On neurological exam, patient was awake, alert and answering questions appropriately  Speech was fluent, without dysarthria or aphasia.    CN  II-XII: grossly intact    MOTOR  Bulk: normal  Tone: normal  Abnormal Movements: none    Strength:     MRC Grading Scale   Right Left   Deltoid 5 5   Biceps 5 5   Triceps 5 5   Wrist Extension - -   Wrist Flexion - -   Finger Extension - -   Finger Abduction - -   Finger Flexion - -   Hip Flexion 5 5   Hip Extension - -   Hip Abduction - -   Hip Adduction - -   Knee Extension 5 5   Knee Flexion 5 5   Ankle Dorsiflexion - -   Ankle Plantarflexion - -   Toe Extension - -  Toe Flexion - -     REFLEXES   Right Left   Biceps 3 3   Triceps - -   Brachioradialis 2 2   Patellar 3 3   Achilles 0 0   Plantar - -   Hoffman - -   Pectoralis - -   Jaw Jerk - -       SENSORY  Light touch: intact throughout    GAIT  General: walks with rolling walker, very wide based, slightly shuffling steps, not clearly magnetic     COORDINATION  Finger nose finger: normal    ================================================================================================================================LABS  Personal Review of prior labs is notable for:   2024  B12 355  A1c 5.9  CMP largely WNL   IMAGING  Personal Review of imaging is notable for:   MRI Brain w/o May 2024 - not available for direct review  IMPRESSION:     No acute intracranial pathology.      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  Recommend correlation with clinical presentation.  Other differential consideration includes central neuronal volume loss.      Age-related neuronal volume loss and microvascular ischemic white matter change.     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   Reviewed reports from MRI cervical and lumbar spine, no significant canal stenosis  OTHER DIAGNOSTICS  Personal Review of other prior diagnostics is notable for: not applicable

## 2024-02-02 ENCOUNTER — Encounter (INDEPENDENT_AMBULATORY_CARE_PROVIDER_SITE_OTHER): Payer: Self-pay | Admitting: NEUROLOGY

## 2024-02-02 NOTE — Progress Notes (Signed)
 FAXED REFERRED TO [facility]

## 2024-03-09 ENCOUNTER — Other Ambulatory Visit (HOSPITAL_COMMUNITY): Payer: Self-pay | Admitting: NEUROLOGY

## 2024-03-09 DIAGNOSIS — R269 Unspecified abnormalities of gait and mobility: Secondary | ICD-10-CM

## 2024-04-19 ENCOUNTER — Emergency Department (HOSPITAL_COMMUNITY)

## 2024-04-19 ENCOUNTER — Other Ambulatory Visit: Payer: Self-pay

## 2024-04-19 ENCOUNTER — Emergency Department
Admission: EM | Admit: 2024-04-19 | Discharge: 2024-04-19 | Disposition: A | Discharge status: Home / Self Care | Attending: Emergency Medicine | Admitting: Emergency Medicine

## 2024-04-19 DIAGNOSIS — J439 Emphysema, unspecified: Secondary | ICD-10-CM | POA: Insufficient documentation

## 2024-04-19 DIAGNOSIS — R0602 Shortness of breath: Secondary | ICD-10-CM

## 2024-04-19 DIAGNOSIS — R079 Chest pain, unspecified: Secondary | ICD-10-CM

## 2024-04-19 DIAGNOSIS — J449 Chronic obstructive pulmonary disease, unspecified: Secondary | ICD-10-CM

## 2024-04-19 DIAGNOSIS — K449 Diaphragmatic hernia without obstruction or gangrene: Secondary | ICD-10-CM | POA: Insufficient documentation

## 2024-04-19 DIAGNOSIS — K8020 Calculus of gallbladder without cholecystitis without obstruction: Secondary | ICD-10-CM | POA: Insufficient documentation

## 2024-04-19 DIAGNOSIS — R0600 Dyspnea, unspecified: Secondary | ICD-10-CM | POA: Insufficient documentation

## 2024-04-19 DIAGNOSIS — I4510 Unspecified right bundle-branch block: Secondary | ICD-10-CM | POA: Insufficient documentation

## 2024-04-19 DIAGNOSIS — R9431 Abnormal electrocardiogram [ECG] [EKG]: Secondary | ICD-10-CM | POA: Insufficient documentation

## 2024-04-19 DIAGNOSIS — Z87891 Personal history of nicotine dependence: Secondary | ICD-10-CM | POA: Insufficient documentation

## 2024-04-19 DIAGNOSIS — I440 Atrioventricular block, first degree: Secondary | ICD-10-CM | POA: Insufficient documentation

## 2024-04-19 LAB — CBC WITH DIFF
BASOPHIL #: 0 x10ˆ3/uL (ref 0.00–0.10)
BASOPHIL %: 1 % (ref 0–1)
EOSINOPHIL #: 0.2 x10ˆ3/uL (ref 0.00–0.60)
EOSINOPHIL %: 3 % (ref 1–8)
HCT: 40.4 % (ref 36.7–47.1)
HGB: 14 g/dL (ref 12.5–16.3)
LYMPHOCYTE #: 1.3 x10ˆ3/uL (ref 1.00–3.00)
LYMPHOCYTE %: 18 % (ref 15–43)
MCH: 31.2 pg (ref 23.8–33.4)
MCHC: 34.6 g/dL (ref 32.5–36.3)
MCV: 90.3 fL (ref 73.0–96.2)
MONOCYTE #: 0.6 x10ˆ3/uL (ref 0.30–1.10)
MONOCYTE %: 8 % (ref 6–14)
MPV: 7.7 fL (ref 7.4–11.4)
NEUTROPHIL #: 5 x10ˆ3/uL (ref 1.85–7.84)
NEUTROPHIL %: 71 % (ref 44–74)
PLATELETS: 191 x10ˆ3/uL (ref 140–440)
RBC: 4.47 x10ˆ6/uL (ref 4.06–5.63)
RDW: 14.5 % (ref 12.1–16.2)
WBC: 7.2 x10ˆ3/uL (ref 3.6–10.2)

## 2024-04-19 LAB — ECG 12 LEAD
Atrial Rate: 69 {beats}/min
Calculated P Axis: 22 degrees
Calculated R Axis: -29 degrees
Calculated T Axis: 12 degrees
PR Interval: 210 ms
QRS Duration: 158 ms
QT Interval: 460 ms
QTC Calculation: 492 ms
Ventricular rate: 69 {beats}/min

## 2024-04-19 LAB — COMPREHENSIVE METABOLIC PANEL, NON-FASTING
ALBUMIN/GLOBULIN RATIO: 1.6 — ABNORMAL HIGH (ref 0.8–1.4)
ALBUMIN: 4.2 g/dL (ref 3.5–5.7)
ALKALINE PHOSPHATASE: 91 U/L (ref 34–104)
ALT (SGPT): 14 U/L (ref 7–52)
ANION GAP: 9 mmol/L (ref 4–13)
AST (SGOT): 17 U/L (ref 13–39)
BILIRUBIN TOTAL: 0.5 mg/dL (ref 0.3–1.0)
BUN/CREA RATIO: 19 (ref 6–22)
BUN: 20 mg/dL (ref 7–25)
CALCIUM, CORRECTED: 9.4 mg/dL (ref 8.9–10.8)
CALCIUM: 9.6 mg/dL (ref 8.6–10.3)
CHLORIDE: 111 mmol/L — ABNORMAL HIGH (ref 98–107)
CO2 TOTAL: 25 mmol/L (ref 21–31)
CREATININE: 1.05 mg/dL (ref 0.60–1.30)
ESTIMATED GFR: 70 mL/min/1.73mˆ2 (ref >59)
GLOBULIN: 2.7 (ref 2.0–3.5)
GLUCOSE: 109 mg/dL (ref 74–109)
OSMOLALITY, CALCULATED: 292 mosm/kg — ABNORMAL HIGH (ref 270–290)
POTASSIUM: 4 mmol/L (ref 3.5–5.1)
PROTEIN TOTAL: 6.9 g/dL (ref 6.4–8.9)
SODIUM: 145 mmol/L (ref 136–145)

## 2024-04-19 LAB — COVID-19, FLU A/B, RSV RAPID BY PCR
INFLUENZA VIRUS TYPE A: NOT DETECTED
INFLUENZA VIRUS TYPE B: NOT DETECTED
RESPIRATORY SYNCYTIAL VIRUS (RSV): NOT DETECTED
SARS-CoV-2: NOT DETECTED

## 2024-04-19 LAB — PTT (PARTIAL THROMBOPLASTIN TIME): APTT: 33.1 s (ref 25.0–38.0)

## 2024-04-19 LAB — B-TYPE NATRIURETIC PEPTIDE (BNP),PLASMA: BNP: 92 pg/mL (ref 1–100)

## 2024-04-19 LAB — TROPONIN-I
TROPONIN I: 7 ng/L (ref <20)
TROPONIN I: 9 ng/L (ref <20)

## 2024-04-19 LAB — PT/INR
INR: 1.15 — ABNORMAL HIGH (ref 0.84–1.10)
PROTHROMBIN TIME: 13 s — ABNORMAL HIGH (ref 9.8–12.7)

## 2024-04-19 MED ORDER — AEROCHAMBER W/ FLOWSIGNAL SPACER: Freq: Once | Status: AC

## 2024-04-19 MED ORDER — PREDNISONE 20 MG TABLET
40.0000 mg | ORAL_TABLET | ORAL | Status: AC
  Administered 2024-04-19: 40 mg via ORAL

## 2024-04-19 MED ORDER — IOHEXOL 350 MG IODINE/ML INTRAVENOUS SOLUTION
75.0000 mL | INTRAVENOUS | Status: AC
  Administered 2024-04-19: 75 mL via INTRAVENOUS

## 2024-04-19 MED ORDER — PREDNISONE 20 MG TABLET
ORAL_TABLET | ORAL | Status: AC
  Filled 2024-04-19: qty 2

## 2024-04-19 MED ORDER — PREDNISONE 20 MG TABLET: 20.0000 mg | ORAL_TABLET | Freq: Every day | ORAL | 0 refills | Status: AC

## 2024-04-19 MED ORDER — ALBUTEROL SULFATE HFA 90 MCG/ACTUATION AEROSOL INHALER
4.0000 | INHALATION_SPRAY | Freq: Four times a day (QID) | RESPIRATORY_TRACT | Status: DC | PRN
  Administered 2024-04-19: 4 via RESPIRATORY_TRACT
  Filled 2024-04-19: qty 6.7

## 2024-04-19 NOTE — ED Provider Notes (Signed)
 Emergency Medicine    Name: Tanner [name]  Age and Gender: 84 y.o. male  Date of Birth: 04/10/1940  MRN: [id number]  PCP: Christopher Darin Parrish, DO    CC:  Chief Complaint   Patient presents with    Leg Swelling    Shortness of Breath    Foot Laceration       HPI:  Tanner [name] is a 84 y.o. White male with history of CHF. He reports two weeks of increased dyspnea after having his medications changed and one of his diuretics stopped.    He has increased leg swelling as well, in both legs. Family says his left foot 'split open.'    He denies chest pain, orthopnea, cough or fever.      [place] Pain Rating Scale     On a scale of 0-10, during the past 24 hours, pain has interfered with you usual activity:       On a scale of 0-10, during the past 24 hours, pain has interfered with your sleep:      On a scale of 0-10, during the past 24 hours, pain has affected your mood:       On a scale of 0-10, during the past 24 hours, pain has contributed to your stress:       On a scale of 0-10, what is your overall pain Rating: 0        Below pertinent information reviewed with patient:  Past Medical History:   Diagnosis Date    Ataxia     Congestive heart failure     Coronary artery disease     Depression     Disorder of thyroid      Esophageal reflux     Heart disease     Hx of thyroid  cancer     Hypercholesterolemia     Hyperlipidemia     Hypertension     Lung cancer     Meniere disease     Pacemaker     Pituitary tumor    COPD          Allergies[1]    Past Surgical History:   Procedure Laterality Date    CARDIAC PACEMAKER PLACEMENT  10/29/2021    DUAL CHAMBER PACEMAKER INSERTION WITH LEADS    HX APPENDECTOMY      HX CATARACT REMOVAL Bilateral     HX CORONARY ARTERY BYPASS GRAFT      HX HEART SURGERY      open heart x2    HX MITRAL VALVE REPAIR      HX THYROIDECTOMY      LUNG REMOVAL, PARTIAL Right            Social History        Objective:    ED Triage Vitals [04/19/24 2003]   BP (Non-Invasive) 130/69   Heart Rate 73    Respiratory Rate 20   Temperature 36.8 C (98.2 F)   SpO2 98 %   Weight 86.2 kg (190 lb)   Height 1.829 m (6')     Filed Vitals:    04/19/24 2215 04/19/24 2230 04/19/24 2245 04/19/24 2300   BP: 114/68 (!) 130/59 (!) 153/84 (!) 140/77   Pulse: 75 63 66 65   Resp: 12 14 15 16    Temp:       SpO2: 94% 95% 95% 97%       Nursing notes and vital signs reviewed.    Constitutional - No acute distress.  Alert  and Active. Appears mildly short of breath.  HEENT - Normocephalic. Atraumatic. PERRL. EOMI. Conjunctiva clear. Oropharynx with no erythema, lesions, or exudates. Moist mucous membranes.   Neck - Trachea midline. No stridor. No hoarseness.  Cardiac - Regular rate and rhythm. No murmurs, rubs, or gallops.  Respiratory - Clear to auscultation bilaterally. No rales, wheezes or rhonchi.  Abdomen - Non-tender, soft, non-distended. No rebound or guarding.   Musculoskeletal - Good AROM. No muscle or joint tenderness appreciated. Bilateral leg edema, symmetric.  Small open wound of volar surface of 5th toe.  Skin - Warm and dry, without any rashes or other lesions.  Neuro - Cranial nerves II-XII are grossly intact.  Moving all extremities symmetrically.    Any pertinent labs and imaging obtained during this encounter reviewed below in MDM.    MDM/ED Course:    EKG shows a sinus rhythm, rate of 69. PR 210, QRS 158. RBBB. No STEMI.    Troponin, BNP and chest xray are normal.    Covid/flu/rsv negative.    CTA: no PE. Changes of emphysema.  Cholelithiasis. Small hiatal hernia.    He feels back to normal. He says that he has some COPD and is usually fine unless he exerts.    He is given prednisone  and albuterol  MDI with spacer.    I advised him to follow up with his PCP about his medications/diuretics and his leg swelling.    He is awake, alert and in no distress at discharge.    Medical Decision Making  Amount and/or Complexity of Data Reviewed  Labs: ordered.  Radiology: ordered.    Risk  Prescription drug management.              Orders Placed This Encounter    XR AP MOBILE CHEST    CT ANGIO CHEST W IV CONTRAST    CBC/DIFF    B-TYPE NATRIURETIC PEPTIDE    PTT (PARTIAL THROMBOPLASTIN TIME)    PT/INR    TROPONIN NOW    TROPONIN IN ONE HOUR    TROPONIN IN THREE HOURS    CBC WITH DIFF    COVID-19, FLU A/B, RSV RAPID BY PCR    COMPREHENSIVE METABOLIC PANEL, NON-FASTING    ECG 12 LEAD    AND Linked Order Group     albuterol  90 mcg per inhalation oral inhaler - Respiratory to administer     aerochamber w/ flowsignal spacer inhalational device    predniSONE  (DELTASONE ) tablet    iohexol  (OMNIPAQUE  350) solution    predniSONE  (DELTASONE ) 20 mg Oral Tablet           Impression:   Clinical Impression   Dyspnea (Primary)   COPD (chronic obstructive pulmonary disease)       Disposition: Discharged          Portions of this note may have been dictated using voice recognition software.     Aliene Silvius, MD  [facility] ED    -----------------------  Results for orders placed or performed during the hospital encounter of 04/19/24 (from the past 12 hours)   B-TYPE NATRIURETIC PEPTIDE   Result Value Ref Range    BNP 92 1 - 100 pg/mL   PTT (PARTIAL THROMBOPLASTIN TIME)   Result Value Ref Range    APTT 33.1 25.0 - 38.0 seconds   PT/INR   Result Value Ref Range    PROTHROMBIN TIME 13.0 (H) 9.8 - 12.7 seconds    INR 1.15 (H) 0.84 - 1.10  TROPONIN NOW   Result Value Ref Range    TROPONIN I 7 <20 ng/L   CBC WITH DIFF   Result Value Ref Range    WBC 7.2 3.6 - 10.2 x103/uL    RBC 4.47 4.06 - 5.63 x106/uL    HGB 14.0 12.5 - 16.3 g/dL    HCT 59.5 63.2 - 52.8 %    MCV 90.3 73.0 - 96.2 fL    MCH 31.2 23.8 - 33.4 pg    MCHC 34.6 32.5 - 36.3 g/dL    RDW 85.4 87.8 - 83.7 %    PLATELETS 191 140 - 440 x103/uL    MPV 7.7 7.4 - 11.4 fL    NEUTROPHIL % 71 44 - 74 %    LYMPHOCYTE % 18 15 - 43 %    MONOCYTE % 8 6 - 14 %    EOSINOPHIL % 3 1 - 8 %    BASOPHIL % 1 0 - 1 %    NEUTROPHIL # 5.00 1.85 - 7.84 x103/uL    LYMPHOCYTE # 1.30 1.00 - 3.00 x103/uL     MONOCYTE # 0.60 0.30 - 1.10 x103/uL    EOSINOPHIL # 0.20 0.00 - 0.60 x103/uL    BASOPHIL # 0.00 0.00 - 0.10 x103/uL   COMPREHENSIVE METABOLIC PANEL, NON-FASTING   Result Value Ref Range    SODIUM 145 136 - 145 mmol/L    POTASSIUM 4.0 3.5 - 5.1 mmol/L    CHLORIDE 111 (H) 98 - 107 mmol/L    CO2 TOTAL 25 21 - 31 mmol/L    ANION GAP 9 4 - 13 mmol/L    BUN 20 7 - 25 mg/dL    CREATININE 8.94 9.39 - 1.30 mg/dL    BUN/CREA RATIO 19 6 - 22    ESTIMATED GFR 70 >59 mL/min/1.52m2    ALBUMIN 4.2 3.5 - 5.7 g/dL    CALCIUM 9.6 8.6 - 89.6 mg/dL    GLUCOSE 890 74 - 890 mg/dL    ALKALINE PHOSPHATASE 91 34 - 104 U/L    ALT (SGPT) 14 7 - 52 U/L    AST (SGOT) 17 13 - 39 U/L    BILIRUBIN TOTAL 0.5 0.3 - 1.0 mg/dL    PROTEIN TOTAL 6.9 6.4 - 8.9 g/dL    ALBUMIN/GLOBULIN RATIO 1.6 (H) 0.8 - 1.4    OSMOLALITY, CALCULATED 292 (H) 270 - 290 mOsm/kg    CALCIUM, CORRECTED 9.4 8.9 - 10.8 mg/dL    GLOBULIN 2.7 2.0 - 3.5   TROPONIN IN ONE HOUR   Result Value Ref Range    TROPONIN I 9 <20 ng/L   COVID-19, FLU A/B, RSV RAPID BY PCR   Result Value Ref Range    SARS-CoV-2 Not Detected Not Detected    INFLUENZA VIRUS TYPE A Not Detected Not Detected    INFLUENZA VIRUS TYPE B Not Detected Not Detected    RESPIRATORY SYNCYTIAL VIRUS (RSV) Not Detected Not Detected     CT ANGIO CHEST W IV CONTRAST   Final Result   No evidence of pulmonary embolism. Emphysematous changes. No focal consolidation.      Small hiatal hernia. Cholelithiasis.                  Radiologist location ID: [id number]         XR AP MOBILE CHEST   Final Result   NO ACUTE FINDINGS.                  Radiologist  location ID: [id number]                  [1]   Allergies  Allergen Reactions    Sulfa (Sulfonamides) Rash and Nausea/ Vomiting

## 2024-04-19 NOTE — ED Triage Notes (Signed)
 Son verb His feet are extremely swollen left foot is busted open, denies injury, [facility]  and [facility] amLODIPine two weeks ago, sent by ME for further eval of bilateral feet swelling and sob tonight

## 2024-04-19 NOTE — Discharge Instructions (Signed)
 Use inhaler, 2-4 puffs every 4-6 hours with spacer.    Use prednisone .    Return if worse.    Keep neosporin on the cut on your foot.  Follow up with Dr. Elodie about your medications and swelling.

## 2024-04-19 NOTE — ED Nurses Note (Signed)
 Patient wheeled out of this ED by family at this time.

## 2024-04-19 NOTE — ED Nurses Note (Signed)
 Patient into ED07 with complaints of bilateral lower extremity swelling. Patient has recent med change, taken off of torsemide . Patient reports increased SOB x3 wks. Patient denies chest pain. Lung sounds clear bilaterally, 95% on RA. S1, S2 heard upon auscultation. Patient has 3+ pitting edema to bilateral lower extremities, pulses 2+, strong and equal. Patient has multiple lacs to left toes, pictures placed in chart. Patient reports numbness in bilateral feet. Patient denies hx of CHF, DM. Patient has had multiple open heart surgeries. Patient placed on cardiac monitor at this time. IV access established. Patient denies further needs or concerns at this time. Call light within reach.

## 2024-04-19 NOTE — ED Nurses Note (Signed)
 Patient discharged home with family.  AVS reviewed with patient/care giver.  A written copy of the AVS and discharge instructions was given to the patient/care giver. IV access discontinued, catheter intact upon removal, bleeding controlled. Questions sufficiently answered as needed.  Patient/care giver encouraged to follow up with PCP as indicated.  In the event of an emergency, patient/care giver instructed to call 911 or go to the nearest emergency room.

## 2024-04-20 DIAGNOSIS — R9431 Abnormal electrocardiogram [ECG] [EKG]: Secondary | ICD-10-CM

## 2024-04-20 DIAGNOSIS — I440 Atrioventricular block, first degree: Secondary | ICD-10-CM

## 2024-04-20 DIAGNOSIS — I4510 Unspecified right bundle-branch block: Secondary | ICD-10-CM

## 2024-07-24 ENCOUNTER — Encounter (INDEPENDENT_AMBULATORY_CARE_PROVIDER_SITE_OTHER): Payer: Self-pay | Admitting: NEUROLOGY

## 2024-07-26 ENCOUNTER — Ambulatory Visit (HOSPITAL_COMMUNITY): Payer: Self-pay

## 2024-08-07 ENCOUNTER — Encounter (INDEPENDENT_AMBULATORY_CARE_PROVIDER_SITE_OTHER): Payer: Self-pay | Admitting: Physician Assistant
